# Patient Record
Sex: Female | Born: 1985 | Race: White | Hispanic: No | Marital: Married | Smoking: Never smoker
Health system: Southern US, Community
[De-identification: ages and names within clinical notes are randomized; demographics above are authoritative.]

## PROBLEM LIST (undated history)

## (undated) ENCOUNTER — Ambulatory Visit: Admission: EM | Payer: BLUE CROSS/BLUE SHIELD

## (undated) DIAGNOSIS — O24410 Gestational diabetes mellitus in pregnancy, diet controlled: Secondary | ICD-10-CM

## (undated) DIAGNOSIS — D649 Anemia, unspecified: Secondary | ICD-10-CM

## (undated) HISTORY — DX: Anemia, unspecified: D64.9

## (undated) HISTORY — PX: APPENDECTOMY: SHX54

## (undated) HISTORY — PX: OOPHORECTOMY: SHX86

---

## 2012-06-21 ENCOUNTER — Emergency Department (INDEPENDENT_AMBULATORY_CARE_PROVIDER_SITE_OTHER)
Admission: EM | Admit: 2012-06-21 | Discharge: 2012-06-21 | Disposition: A | Discharge status: Home / Self Care | Payer: BC Managed Care – PPO | Source: Home / Self Care | Attending: Family Medicine | Admitting: Family Medicine

## 2012-06-21 ENCOUNTER — Encounter: Payer: Self-pay | Admitting: *Deleted

## 2012-06-21 ENCOUNTER — Emergency Department (INDEPENDENT_AMBULATORY_CARE_PROVIDER_SITE_OTHER): Payer: BC Managed Care – PPO

## 2012-06-21 DIAGNOSIS — R509 Fever, unspecified: Secondary | ICD-10-CM

## 2012-06-21 DIAGNOSIS — J111 Influenza due to unidentified influenza virus with other respiratory manifestations: Secondary | ICD-10-CM

## 2012-06-21 DIAGNOSIS — R52 Pain, unspecified: Secondary | ICD-10-CM

## 2012-06-21 DIAGNOSIS — R05 Cough: Secondary | ICD-10-CM

## 2012-06-21 LAB — POCT INFLUENZA A/B
Influenza A, POC: POSITIVE
Influenza B, POC: NEGATIVE

## 2012-06-21 MED ORDER — OSELTAMIVIR PHOSPHATE 75 MG PO CAPS: 75.0000 mg | ORAL_CAPSULE | Freq: Two times a day (BID) | ORAL | Status: DC

## 2012-06-21 NOTE — ED Notes (Signed)
C/o body aches, fever, dry cough and fatigue x 2 days. Her baby tested +for influenza recently, no flu vaccine received this year. Taken tylenol otc. She is breastfeeding.

## 2012-06-21 NOTE — ED Provider Notes (Signed)
History     CSN: [id number]  Arrival date & time 06/21/12  1247   First MD Initiated Contact with Patient 06/21/12 1300      Chief Complaint  Patient presents with  . Influenza    HPI  URI Symptoms Onset: 2 days  Description: generalized malaise, fever, chills, body aches, mild URI sxs, cough Modifying factors:  33month old infant with flu   Symptoms Nasal discharge: yes Fever: yes Sore throat: no Cough: yes Wheezing: no Ear pain: no GI symptoms: no Sick contacts: yes  Red Flags  Stiff neck: no Dyspnea: no Rash: no Swallowing difficulty: no  Sinusitis Risk Factors Headache/face pain: no Double sickening: no tooth pain: no  Allergy Risk Factors Sneezing: no Itchy scratchy throat: no Seasonal symptoms: no  Flu Risk Factors Headache: yes muscle aches: yes severe fatigue: yes   History reviewed. No pertinent past medical history.  Past Surgical History  Procedure Date  . Appendectomy   . Oophorectomy     Family History  Problem Relation Age of Onset  . Diabetes Other     History  Substance Use Topics  . Smoking status: Never Smoker   . Smokeless tobacco: Never Used  . Alcohol Use: No    OB History    Grav Para Term Preterm Abortions TAB SAB Ect Mult Living                  Review of Systems  All other systems reviewed and are negative.    Allergies  Penicillins  Home Medications  No current outpatient prescriptions on file.  BP 111/74  Pulse 77  Temp 99.4 F (37.4 C) (Oral)  Resp 16  Ht 5\' 2"  (1.575 m)  Wt 155 lb (70.308 kg)  BMI 28.35 kg/m2  SpO2 97%  Physical Exam  Constitutional: She appears well-developed and well-nourished.  HENT:  Head: Normocephalic and atraumatic.  Right Ear: External ear normal.  Left Ear: External ear normal.       +nasal erythema, rhinorrhea bilaterally, + post oropharyngeal erythema    Eyes: Conjunctivae normal are normal. Pupils are equal, round, and reactive to light.  Neck: Normal  range of motion. Neck supple.  Cardiovascular: Normal rate, regular rhythm and normal heart sounds.   Pulmonary/Chest: Effort normal.       ? Decreased breath sounds in RLL   Abdominal: Soft.  Musculoskeletal: Normal range of motion.  Neurological: She is alert.  Skin: Skin is warm.    ED Course  Procedures (including critical care time)   Labs Reviewed  POCT INFLUENZA A/B   Dg Chest 2 View  06/21/2012  *RADIOLOGY REPORT*  Clinical Data: Bodyaches.  CHEST - 2 VIEW  Comparison: None.  Findings: The lungs are clear and fully expanded.  No infiltrates or edema.  No masses.  No effusions or pneumothoraces.  The heart, mediastinum and hilar contours are normal.  There are no acute bony changes.  IMPRESSION: No active disease.  Normal chest.   Original Report Authenticated By: Sander Radon, M.D.      1. Influenza       MDM  CXR negative for infiltrate Will place on tamiflu for treatment.  Discussed supportive care and infectious/resp red flags.  Otherwise follow up as needed.     The patient and/or caregiver has been counseled thoroughly with regard to treatment plan and/or medications prescribed including dosage, schedule, interactions, rationale for use, and possible side effects and they verbalize understanding. Diagnoses and expected course of recovery  discussed and will return if not improved as expected or if the condition worsens. Patient and/or caregiver verbalized understanding.             Doree Albee, MD 06/21/12 1353

## 2015-04-17 ENCOUNTER — Encounter: Payer: Self-pay | Admitting: Emergency Medicine

## 2015-04-17 ENCOUNTER — Emergency Department
Admission: EM | Admit: 2015-04-17 | Discharge: 2015-04-17 | Disposition: A | Discharge status: Home / Self Care | Payer: BLUE CROSS/BLUE SHIELD | Source: Home / Self Care | Attending: Family Medicine | Admitting: Family Medicine

## 2015-04-17 DIAGNOSIS — J208 Acute bronchitis due to other specified organisms: Secondary | ICD-10-CM

## 2015-04-17 MED ORDER — BENZONATATE 200 MG PO CAPS: 200.0000 mg | ORAL_CAPSULE | Freq: Every day | ORAL | Status: DC

## 2015-04-17 MED ORDER — AZITHROMYCIN 250 MG PO TABS: ORAL_TABLET | ORAL | Status: DC

## 2015-04-17 NOTE — Discharge Instructions (Signed)
Take plain guaifenesin (1200mg extended release tabs such as Mucinex) twice daily, with plenty of water, for cough and congestion.  May add Pseudoephedrine (30mg, one or two every 4 to 6 hours) for sinus congestion.  Get adequate rest.   °May use Afrin nasal spray (or generic oxymetazoline) twice daily for about 5 days and then discontinue.  Also recommend using saline nasal spray several times daily and saline nasal irrigation (AYR is a common brand).   °Try warm salt water gargles for sore throat.  °Stop all antihistamines for now, and other non-prescription cough/cold preparations. °Follow-up with family doctor if not improving about 7 to10 days.  °

## 2015-04-17 NOTE — ED Provider Notes (Signed)
CSN: [id number]     Arrival date & time 04/17/15  4098 History   First MD Initiated Contact with Patient 04/17/15 1002     Chief Complaint  Patient presents with  . Cough      HPI Comments: About 2.5 weeks ago patient developed typical cold-like symptoms including mild sore throat, sinus congestion, headache, and fatigue.  A cough developed about 3 days later. The non-productive cough persists, and is worse at night.  During the past 3 nights she has had chills.  She feels tightness in her anterior chest. She has had pneumonia in the distant past.  The history is provided by the patient.    History reviewed. No pertinent past medical history. Past Surgical History  Procedure Laterality Date  . Appendectomy    . Oophorectomy     Family History  Problem Relation Age of Onset  . Diabetes Other   . Asthma Mother    Social History  Substance Use Topics  . Smoking status: Never Smoker   . Smokeless tobacco: Never Used  . Alcohol Use: No   OB History    No data available     Review of Systems + sore throat + hoarse + cough No pleuritic pain No wheezing + nasal congestion + post-nasal drainage No sinus pain/pressure No itchy/red eyes No  earache No hemoptysis No SOB No fever, + chills No nausea No vomiting No abdominal pain No diarrhea No urinary symptoms No skin rash + fatigue No myalgias + headache Used OTC meds without relief  Allergies  Penicillins  Home Medications   Prior to Admission medications   Medication Sig Start Date End Date Taking? Authorizing Provider  azithromycin (ZITHROMAX Z-PAK) 250 MG tablet Take 2 tabs today; then begin one tab once daily for 4 more days. 04/17/15   Lattie Haw, MD  benzonatate (TESSALON) 200 MG capsule Take 1 capsule (200 mg total) by mouth at bedtime. Take as needed for cough 04/17/15   Lattie Haw, MD   Meds Ordered and Administered this Visit  Medications - No data to display  BP 111/73 mmHg  Pulse 65   Temp(Src) 98 F (36.7 C) (Oral)  Ht  (1.575 m)  Wt 146 lb (66.225 kg)  BMI 26.70 kg/m2  SpO2 100%  LMP 03/20/2015 (Exact Date) No data found.   Physical Exam Nursing notes and Vital Signs reviewed. Appearance:  Patient appears stated age, and in no acute distress Eyes:  Pupils are equal, round, and reactive to light and accomodation.  Extraocular movement is intact.  Conjunctivae are not inflamed  Ears:  Canals normal.  Tympanic membranes normal.  Nose:  Mildly congested turbinates.  No sinus tenderness.  Pharynx:  Normal Neck:  Supple.  Tender enlarged posterior nodes are palpated bilaterally  Lungs:  Clear to auscultation.  Breath sounds are equal.  Moving air well. Heart:  Regular rate and rhythm without murmurs, rubs, or gallops.  Abdomen:  Nontender without masses or hepatosplenomegaly.  Bowel sounds are present.  No CVA or flank tenderness.  Extremities:  No edema.  No calf tenderness Skin:  No rash present.   ED Course  Procedures none     MDM   1. Acute bronchitis due to other specified organisms; suspect early bacterial secondary infection   Begin Z-pak for atypical coverage.  Prescription written for Benzonatate [facility]) to take at bedtime for night-time cough.  Take plain guaifenesin (  extended release tabs such as Mucinex) twice daily, with plenty of  water, for cough and congestion.  May add Pseudoephedrine (30mg , one or two every 4 to 6 hours) for sinus congestion.  Get adequate rest.   May use Afrin nasal spray (or generic oxymetazoline) twice daily for about 5 days and then discontinue.  Also recommend using saline nasal spray several times daily and saline nasal irrigation (AYR is a common brand).   Try warm salt water gargles for sore throat.  Stop all antihistamines for now, and other non-prescription cough/cold preparations.   Follow-up with family doctor if not improving about 7 to10 days.     Lattie HawStephen A Edilson Vital, MD 04/17/15 [phone]

## 2015-04-17 NOTE — ED Notes (Signed)
Dry cough x 2 weeks, congestion, headache, hoarseness, chills started about 3 days ago

## 2015-06-01 NOTE — L&D Delivery Note (Signed)
Delivery Note At 6:38 AM a viable female was delivered via  (Presentation: vertwx;LOA  ).  APGAR: 8, 8; weight  .   Placenta status: spont,shultz .  Cord:3vc  with the following complications:none .  Cord pH: n/a  Anesthesia:  epidural Episiotomy:  none Lacerations:  2nd perineal Suture Repair: 3.0 vicryl Est. Blood Loss (mL):    Mom to postpartum.  Baby to Couplet care / Skin to Skin.  Judy Alvarez 01/26/2016, 6:52 AM

## 2015-06-20 ENCOUNTER — Ambulatory Visit (INDEPENDENT_AMBULATORY_CARE_PROVIDER_SITE_OTHER): Payer: PRIVATE HEALTH INSURANCE

## 2015-06-20 ENCOUNTER — Ambulatory Visit (INDEPENDENT_AMBULATORY_CARE_PROVIDER_SITE_OTHER): Payer: BLUE CROSS/BLUE SHIELD | Admitting: Family

## 2015-06-20 ENCOUNTER — Other Ambulatory Visit: Payer: Self-pay | Admitting: Family

## 2015-06-20 ENCOUNTER — Encounter: Payer: Self-pay | Admitting: Family

## 2015-06-20 ENCOUNTER — Ambulatory Visit: Payer: PRIVATE HEALTH INSURANCE

## 2015-06-20 VITALS — BP 112/73 | HR 85 | Wt 150.0 lb

## 2015-06-20 DIAGNOSIS — O26899 Other specified pregnancy related conditions, unspecified trimester: Secondary | ICD-10-CM

## 2015-06-20 DIAGNOSIS — Z3A08 8 weeks gestation of pregnancy: Secondary | ICD-10-CM | POA: Diagnosis not present

## 2015-06-20 DIAGNOSIS — Z1151 Encounter for screening for human papillomavirus (HPV): Secondary | ICD-10-CM | POA: Diagnosis not present

## 2015-06-20 DIAGNOSIS — R102 Pelvic and perineal pain: Secondary | ICD-10-CM

## 2015-06-20 DIAGNOSIS — Z3481 Encounter for supervision of other normal pregnancy, first trimester: Secondary | ICD-10-CM | POA: Diagnosis not present

## 2015-06-20 DIAGNOSIS — Z124 Encounter for screening for malignant neoplasm of cervix: Secondary | ICD-10-CM | POA: Diagnosis not present

## 2015-06-20 DIAGNOSIS — O26891 Other specified pregnancy related conditions, first trimester: Secondary | ICD-10-CM

## 2015-06-20 DIAGNOSIS — IMO0002 Reserved for concepts with insufficient information to code with codable children: Secondary | ICD-10-CM

## 2015-06-20 DIAGNOSIS — Z3480 Encounter for supervision of other normal pregnancy, unspecified trimester: Secondary | ICD-10-CM | POA: Insufficient documentation

## 2015-06-20 DIAGNOSIS — Z90721 Acquired absence of ovaries, unilateral: Secondary | ICD-10-CM

## 2015-06-20 MED ORDER — DOXYLAMINE-PYRIDOXINE 10-10 MG PO TBEC: DELAYED_RELEASE_TABLET | ORAL | Status: DC

## 2015-06-20 NOTE — Progress Notes (Signed)
Pt here for initial prenatal visit. Current complaints are constipation and nausea Pt states she is due for a pap today. Bedside US shows single IUP measuring [redacted]w[redacted]d CRL - 169 FHR Desires [facility] - will call with new phone number on Monday

## 2015-06-20 NOTE — Progress Notes (Signed)
   Subjective:    Judy Alvarez is a G3P2000 [redacted]w[redacted]d being seen today for her first obstetrical visit.  Her obstetrical history is uncomplicated.  Hx of two NSVD at [facility].  Patient does intend to breast feed. Pregnancy history fully reviewed.  Patient reports nausea and intermittent pain on left pelvic with intercourse.  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  Unknown the specific name. Ceasar Mons Vitals:   06/20/15 0904  BP: 112/73  Pulse: 85  Weight: 150 lb (68.04 kg)    HISTORY: OB History  Gravida Para Term Preterm AB SAB TAB Ectopic Multiple Living  # Outcome Date GA Lbr Len/2nd Weight Sex Delivery Anes PTL Lv  3 Current           2 Term  [redacted]w[redacted]d    Vag-Spont     1 Term  [redacted]w[redacted]d    Vag-Spont        Past Medical History  Diagnosis Date  . Anemia    Past Surgical History  Procedure Laterality Date  . Appendectomy    . Oophorectomy     Family History  Problem Relation Age of Onset  . Diabetes Other   . Asthma Mother      Exam   BP 112/73 mmHg  Pulse 85  Wt 150 lb (68.04 kg)  LMP 04/25/2015 (Exact Date) Uterine Size: size equals dates  Pelvic Exam:    Perineum: No Hemorrhoids, Normal Perineum   Vulva: normal   Vagina:  normal mucosa, normal discharge, no palpable nodules   pH: Not done   Cervix: no bleeding following Pap, no cervical motion tenderness and no lesions   Adnexa: normal adnexa and no mass, fullness, tenderness   Bony Pelvis: Adequate  System: Breast:  No nipple retraction or dimpling, No nipple discharge or bleeding, No axillary or supraclavicular adenopathy, Normal to palpation without dominant masses   Skin: normal coloration and turgor, no rashes    Neurologic: negative   Extremities: normal strength, tone, and muscle mass   HEENT neck supple with midline trachea and thyroid without masses   Mouth/Teeth mucous membranes moist, pharynx normal without lesions   Neck supple and no masses   Cardiovascular: regular rate and rhythm, no murmurs or gallops   Respiratory:  appears well, vitals normal, no respiratory distress, acyanotic, normal RR, neck free of mass or lymphadenopathy, chest clear, no wheezing, crepitations, rhonchi, normal symmetric air entry   Abdomen: soft, non-tender; bowel sounds normal; no masses,  no organomegaly   Urinary: urethral meatus normal       Assessment:    Pregnancy: [id number] Pelvic Pain with Intercourse Patient Active Problem List   Diagnosis Date Noted  . Supervision of normal pregnancy, antepartum 06/20/2015        Plan:     Initial labs drawn.  Pap smear collected. RX Diclegis Pelvic ultrasound ordered to assess adnexa Prenatal vitamins. Problem list reviewed and updated. Genetic Screening discussed First Screen: declined. Desires to be a [employer] patient;  Follow up in 4 weeks.  Marlis Edelson 06/20/2015

## 2015-06-20 NOTE — Patient Instructions (Signed)
First Trimester of Pregnancy The first trimester of pregnancy is from week 1 until the end of week 12 (months 1 through 3). A week after a sperm fertilizes an egg, the egg will implant on the wall of the uterus. This embryo will begin to develop into a baby. Genes from you and your partner are forming the baby. The female genes determine whether the baby is a boy or a girl. At 6-8 weeks, the eyes and face are formed, and the heartbeat can be seen on ultrasound. At the end of 12 weeks, all the baby's organs are formed.  Now that you are pregnant, you will want to do everything you can to have a healthy baby. Two of the most important things are to get good prenatal care and to follow your health care provider's instructions. Prenatal care is all the medical care you receive before the baby's birth. This care will help prevent, find, and treat any problems during the pregnancy and childbirth. BODY CHANGES Your body goes through many changes during pregnancy. The changes vary from woman to woman.   You may gain or lose a couple of pounds at first.  You may feel sick to your stomach (nauseous) and throw up (vomit). If the vomiting is uncontrollable, call your health care provider.  You may tire easily.  You may develop headaches that can be relieved by medicines approved by your health care provider.  You may urinate more often. Painful urination may mean you have a bladder infection.  You may develop heartburn as a result of your pregnancy.  You may develop constipation because certain hormones are causing the muscles that push waste through your intestines to slow down.  You may develop hemorrhoids or swollen, bulging veins (varicose veins).  Your breasts may begin to grow larger and become tender. Your nipples may stick out more, and the tissue that surrounds them (areola) may become darker.  Your gums may bleed and may be sensitive to brushing and flossing.  Dark spots or blotches (chloasma,  mask of pregnancy) may develop on your face. This will likely fade after the baby is born.  Your menstrual periods will stop.  You may have a loss of appetite.  You may develop cravings for certain kinds of food.  You may have changes in your emotions from day to day, such as being excited to be pregnant or being concerned that something may go wrong with the pregnancy and baby.  You may have more vivid and strange dreams.  You may have changes in your hair. These can include thickening of your hair, rapid growth, and changes in texture. Some women also have hair loss during or after pregnancy, or hair that feels dry or thin. Your hair will most likely return to normal after your baby is born. WHAT TO EXPECT AT YOUR PRENATAL VISITS During a routine prenatal visit:  You will be weighed to make sure you and the baby are growing normally.  Your blood pressure will be taken.  Your abdomen will be measured to track your baby's growth.  The fetal heartbeat will be listened to starting around week 10 or 12 of your pregnancy.  Test results from any previous visits will be discussed. Your health care provider may ask you:  How you are feeling.  If you are feeling the baby move.  If you have had any abnormal symptoms, such as leaking fluid, bleeding, severe headaches, or abdominal cramping.  If you are using any tobacco products,   including cigarettes, chewing tobacco, and electronic cigarettes.  If you have any questions. Other tests that may be performed during your first trimester include:  Blood tests to find your blood type and to check for the presence of any previous infections. They will also be used to check for low iron levels (anemia) and Rh antibodies. Later in the pregnancy, blood tests for diabetes will be done along with other tests if problems develop.  Urine tests to check for infections, diabetes, or protein in the urine.  An ultrasound to confirm the proper growth  and development of the baby.  An amniocentesis to check for possible genetic problems.  Fetal screens for spina bifida and Down syndrome.  You may need other tests to make sure you and the baby are doing well.  HIV (human immunodeficiency virus) testing. Routine prenatal testing includes screening for HIV, unless you choose not to have this test. HOME CARE INSTRUCTIONS  Medicines  Follow your health care provider's instructions regarding medicine use. Specific medicines may be either safe or unsafe to take during pregnancy.  Take your prenatal vitamins as directed.  If you develop constipation, try taking a stool softener if your health care provider approves. Diet  Eat regular, well-balanced meals. Choose a variety of foods, such as meat or vegetable-based protein, fish, milk and low-fat dairy products, vegetables, fruits, and whole grain breads and cereals. Your health care provider will help you determine the amount of weight gain that is right for you.  Avoid raw meat and uncooked cheese. These carry germs that can cause birth defects in the baby.  Eating four or five small meals rather than three large meals a day may help relieve nausea and vomiting. If you start to feel nauseous, eating a few soda crackers can be helpful. Drinking liquids between meals instead of during meals also seems to help nausea and vomiting.  If you develop constipation, eat more high-fiber foods, such as fresh vegetables or fruit and whole grains. Drink enough fluids to keep your urine clear or pale yellow. Activity and Exercise  Exercise only as directed by your health care provider. Exercising will help you:  Control your weight.  Stay in shape.  Be prepared for labor and delivery.  Experiencing pain or cramping in the lower abdomen or low back is a good sign that you should stop exercising. Check with your health care provider before continuing normal exercises.  Try to avoid standing for long  periods of time. Move your legs often if you must stand in one place for a long time.  Avoid heavy lifting.  Wear low-heeled shoes, and practice good posture.  You may continue to have sex unless your health care provider directs you otherwise. Relief of Pain or Discomfort  Wear a good support bra for breast tenderness.   Take warm sitz baths to soothe any pain or discomfort caused by hemorrhoids. Use hemorrhoid cream if your health care provider approves.   Rest with your legs elevated if you have leg cramps or low back pain.  If you develop varicose veins in your legs, wear support hose. Elevate your feet for 15 minutes, 3-4 times a day. Limit salt in your diet. Prenatal Care  Schedule your prenatal visits by the twelfth week of pregnancy. They are usually scheduled monthly at first, then more often in the last 2 months before delivery.  Write down your questions. Take them to your prenatal visits.  Keep all your prenatal visits as directed by your   health care provider. Safety  Wear your seat belt at all times when driving.  Make a list of emergency phone numbers, including numbers for family, friends, the hospital, and police and fire departments. General Tips  Ask your health care provider for a referral to a local prenatal education class. Begin classes no later than at the beginning of month 6 of your pregnancy.  Ask for help if you have counseling or nutritional needs during pregnancy. Your health care provider can offer advice or refer you to specialists for help with various needs.  Do not use hot tubs, steam rooms, or saunas.  Do not douche or use tampons or scented sanitary pads.  Do not cross your legs for long periods of time.  Avoid cat litter boxes and soil used by cats. These carry germs that can cause birth defects in the baby and possibly loss of the fetus by miscarriage or stillbirth.  Avoid all smoking, herbs, alcohol, and medicines not prescribed by  your health care provider. Chemicals in these affect the formation and growth of the baby.  Do not use any tobacco products, including cigarettes, chewing tobacco, and electronic cigarettes. If you need help quitting, ask your health care provider. You may receive counseling support and other resources to help you quit.  Schedule a dentist appointment. At home, brush your teeth with a soft toothbrush and be gentle when you floss. SEEK MEDICAL CARE IF:   You have dizziness.  You have mild pelvic cramps, pelvic pressure, or nagging pain in the abdominal area.  You have persistent nausea, vomiting, or diarrhea.  You have a bad smelling vaginal discharge.  You have pain with urination.  You notice increased swelling in your face, hands, legs, or ankles. SEEK IMMEDIATE MEDICAL CARE IF:   You have a fever.  You are leaking fluid from your vagina.  You have spotting or bleeding from your vagina.  You have severe abdominal cramping or pain.  You have rapid weight gain or loss.  You vomit blood or material that looks like coffee grounds.  You are exposed to German measles and have never had them.  You are exposed to fifth disease or chickenpox.  You develop a severe headache.  You have shortness of breath.  You have any kind of trauma, such as from a fall or a car accident.   This information is not intended to replace advice given to you by your health care provider. Make sure you discuss any questions you have with your health care provider.   Document Released: 05/11/2001 Document Revised: 06/07/2014 Document Reviewed: 03/27/2013 Elsevier Interactive Patient Education 2016 [employer] Inc[employer]  

## 2015-06-21 LAB — GC/CHLAMYDIA PROBE AMP
CT PROBE, AMP APTIMA: NOT DETECTED
GC PROBE AMP APTIMA: NOT DETECTED

## 2015-06-22 LAB — CULTURE, OB URINE

## 2015-06-23 LAB — PRENATAL PROFILE (SOLSTAS)
Antibody Screen: NEGATIVE
BASOS PCT: 0 % (ref 0–1)
Basophils Absolute: 0 10*3/uL (ref 0.0–0.1)
Eosinophils Absolute: 0.2 10*3/uL (ref 0.0–0.7)
Eosinophils Relative: 2 % (ref 0–5)
HEMATOCRIT: 40.3 % (ref 36.0–46.0)
HEMOGLOBIN: 13.6 g/dL (ref 12.0–15.0)
HEP B S AG: NEGATIVE
HIV: NONREACTIVE
LYMPHS PCT: 25 % (ref 12–46)
Lymphs Abs: 2.1 10*3/uL (ref 0.7–4.0)
MCH: 29.6 pg (ref 26.0–34.0)
MCHC: 33.7 g/dL (ref 30.0–36.0)
MCV: 87.6 fL (ref 78.0–100.0)
MONOS PCT: 6 % (ref 3–12)
MPV: 9.3 fL (ref 8.6–12.4)
Monocytes Absolute: 0.5 10*3/uL (ref 0.1–1.0)
NEUTROS ABS: 5.5 10*3/uL (ref 1.7–7.7)
NEUTROS PCT: 67 % (ref 43–77)
Platelets: 328 10*3/uL (ref 150–400)
RBC: 4.6 MIL/uL (ref 3.87–5.11)
RDW: 14.4 % (ref 11.5–15.5)
RUBELLA: 1.8 {index} — AB (ref <0.90)
Rh Type: POSITIVE
WBC: 8.2 10*3/uL (ref 4.0–10.5)

## 2015-06-23 LAB — CYTOLOGY - PAP

## 2015-07-25 ENCOUNTER — Encounter: Payer: BLUE CROSS/BLUE SHIELD | Admitting: Family

## 2015-07-29 ENCOUNTER — Ambulatory Visit (INDEPENDENT_AMBULATORY_CARE_PROVIDER_SITE_OTHER): Payer: PRIVATE HEALTH INSURANCE | Admitting: Obstetrics & Gynecology

## 2015-07-29 VITALS — BP 106/62 | HR 69 | Wt 153.0 lb

## 2015-07-29 DIAGNOSIS — Z3482 Encounter for supervision of other normal pregnancy, second trimester: Secondary | ICD-10-CM

## 2015-07-29 NOTE — Progress Notes (Signed)
Subjective:  Judy Alvarez is a 30 y.o. MW G3P2002 at [redacted]w[redacted]d being seen today for ongoing prenatal care.  She is currently monitored for the following issues for this low-risk pregnancy and has Supervision of normal pregnancy, antepartum and H/O oophorectomy on her problem list.  Patient reports no complaints.  Contractions: Not present. Vag. Bleeding: None.  Movement: Absent. Denies leaking of fluid.   The following portions of the patient's history were reviewed and updated as appropriate: allergies, current medications, past family history, past medical history, past social history, past surgical history and problem list. Problem list updated.  Objective:   Filed Vitals:   07/29/15 1447  BP: 106/62  Pulse: 69  Weight: 153 lb (69.4 kg)    Fetal Status: Fetal Heart Rate (bpm): 154   Movement: Absent     General:  Alert, oriented and cooperative. Patient is in no acute distress.  Skin: Skin is warm and dry. No rash noted.   Cardiovascular: Normal heart rate noted  Respiratory: Normal respiratory effort, no problems with respiration noted  Abdomen: Soft, gravid, appropriate for gestational age. Pain/Pressure: Absent     Pelvic: Vag. Bleeding: None Vag D/C Character: Thin   Cervical exam deferred        Extremities: Normal range of motion.  Edema: None  Mental Status: Normal mood and affect. Normal behavior. Normal judgment and thought content.   Urinalysis: Urine Protein: Negative Urine Glucose: Negative  Assessment and Plan:  Pregnancy: G3P2002 at [redacted]w[redacted]d  1. Supervision of normal pregnancy, antepartum, second trimester She is considering baby scripts (is getting her phone fixed today)   Preterm labor symptoms and general obstetric precautions including but not limited to vaginal bleeding, contractions, leaking of fluid and fetal movement were reviewed in detail with the patient. Please refer to After Visit Summary for other counseling recommendations.  Return for RTC at 20 weeks  if she is going to do BAbY scripts.   Allie Bossier, MD

## 2015-07-31 ENCOUNTER — Telehealth: Payer: Self-pay | Admitting: *Deleted

## 2015-07-31 DIAGNOSIS — Z3482 Encounter for supervision of other normal pregnancy, second trimester: Secondary | ICD-10-CM

## 2015-07-31 NOTE — Telephone Encounter (Signed)
Enrolled into [facility]

## 2015-08-26 ENCOUNTER — Encounter: Payer: PRIVATE HEALTH INSURANCE | Admitting: Obstetrics & Gynecology

## 2015-09-02 ENCOUNTER — Ambulatory Visit (HOSPITAL_COMMUNITY)
Admission: RE | Admit: 2015-09-02 | Discharge: 2015-09-02 | Disposition: A | Discharge status: Home / Self Care | Payer: PRIVATE HEALTH INSURANCE | Source: Ambulatory Visit | Attending: Obstetrics & Gynecology | Admitting: Obstetrics & Gynecology

## 2015-09-02 ENCOUNTER — Other Ambulatory Visit: Payer: Self-pay | Admitting: Obstetrics & Gynecology

## 2015-09-02 DIAGNOSIS — Z3A18 18 weeks gestation of pregnancy: Secondary | ICD-10-CM

## 2015-09-02 DIAGNOSIS — Z36 Encounter for antenatal screening of mother: Secondary | ICD-10-CM | POA: Insufficient documentation

## 2015-09-02 DIAGNOSIS — Z3482 Encounter for supervision of other normal pregnancy, second trimester: Secondary | ICD-10-CM

## 2015-09-02 DIAGNOSIS — Z3689 Encounter for other specified antenatal screening: Secondary | ICD-10-CM

## 2015-10-10 ENCOUNTER — Ambulatory Visit (INDEPENDENT_AMBULATORY_CARE_PROVIDER_SITE_OTHER): Payer: PRIVATE HEALTH INSURANCE | Admitting: Family

## 2015-10-10 VITALS — BP 112/69 | HR 82 | Wt 159.0 lb

## 2015-10-10 DIAGNOSIS — Z3482 Encounter for supervision of other normal pregnancy, second trimester: Secondary | ICD-10-CM

## 2015-10-10 DIAGNOSIS — I8393 Asymptomatic varicose veins of bilateral lower extremities: Secondary | ICD-10-CM

## 2015-10-10 DIAGNOSIS — I8390 Asymptomatic varicose veins of unspecified lower extremity: Secondary | ICD-10-CM

## 2015-10-10 MED ORDER — MEDICAL COMPRESSION PANTYHOSE MISC: Status: DC

## 2015-10-10 NOTE — Progress Notes (Signed)
Subjective:  Judy ShiversJessica Alvarez is a 30 y.o. G3P2002 at 3438w0d being seen today for ongoing prenatal care.  She is currently monitored for the following issues for this low-risk pregnancy and has Supervision of normal pregnancy, antepartum and H/O oophorectomy on her problem list.  Patient reports varicose veins getting worse on right leg.  Also reports coughing x 2 weeks, no fever, improving.  Contractions: Not present. Vag. Bleeding: None.  Movement: Present. Denies leaking of fluid.   The following portions of the patient's history were reviewed and updated as appropriate: allergies, current medications, past family history, past medical history, past social history, past surgical history and problem list. Problem list updated.  Objective:   Filed Vitals:   10/10/15 1025  BP: 112/69  Pulse: 82  Weight: 159 lb (72.122 kg)    Fetal Status: Fetal Heart Rate (bpm): 146 Fundal Height: 25 cm Movement: Present     General:  Alert, oriented and cooperative. Patient is in no acute distress.  Skin: Skin is warm and dry. No rash noted.   Cardiovascular: Normal heart rate noted  Respiratory: Normal respiratory effort, no problems with respiration noted; lungs CTA bilat  Abdomen: Soft, gravid, appropriate for gestational age. Pain/Pressure: Absent     Pelvic: Vag. Bleeding: None Vag D/C Character: Thin   Cervical exam deferred        Extremities: Normal range of motion.  Edema: None; right leg with varicosities; no swelling or warmth  Mental Status: Normal mood and affect. Normal behavior. Normal judgment and thought content.   Urinalysis: Urine Protein: Negative Urine Glucose: Negative  Assessment and Plan:  Pregnancy: G3P2002 at 2938w0d  1. Supervision of normal pregnancy, antepartum, second trimester - Discussed third trimester labs for next visit  2. Varicose vein of leg - Compression hose - Elastic Bandages & Supports (MEDICAL COMPRESSION PANTYHOSE) MISC; Pregnancy compression pantyhose,  15-20 compression  Dispense: 1 each; Refill: 0  Preterm labor symptoms and general obstetric precautions including but not limited to vaginal bleeding, contractions, leaking of fluid and fetal movement were reviewed in detail with the patient. Please refer to After Visit Summary for other counseling recommendations.  Return in about 4 weeks (around 11/07/2015).   Eino FarberWalidah Kennith GainN Karim, CNM

## 2015-10-10 NOTE — Patient Instructions (Signed)
   [facility] [address]  [place], [place] [id number]

## 2015-11-03 ENCOUNTER — Ambulatory Visit (INDEPENDENT_AMBULATORY_CARE_PROVIDER_SITE_OTHER): Payer: Medicaid Other | Admitting: Obstetrics & Gynecology

## 2015-11-03 VITALS — BP 99/63 | HR 74 | Wt 163.0 lb

## 2015-11-03 DIAGNOSIS — Z36 Encounter for antenatal screening of mother: Secondary | ICD-10-CM | POA: Diagnosis not present

## 2015-11-03 DIAGNOSIS — Z3482 Encounter for supervision of other normal pregnancy, second trimester: Secondary | ICD-10-CM

## 2015-11-03 LAB — CBC
HEMATOCRIT: 36.4 % (ref 35.0–45.0)
HEMOGLOBIN: 12 g/dL (ref 11.7–15.5)
MCH: 30.3 pg (ref 27.0–33.0)
MCHC: 33 g/dL (ref 32.0–36.0)
MCV: 91.9 fL (ref 80.0–100.0)
MPV: 8.8 fL (ref 7.5–12.5)
Platelets: 230 10*3/uL (ref 140–400)
RBC: 3.96 MIL/uL (ref 3.80–5.10)
RDW: 14 % (ref 11.0–15.0)
WBC: 7.9 10*3/uL (ref 3.8–10.8)

## 2015-11-03 LAB — GLUCOSE TOLERANCE, 1 HOUR (50G) W/O FASTING: Glucose, 1 Hr, gestational: 109 mg/dL (ref <140)

## 2015-11-03 NOTE — Progress Notes (Signed)
Subjective:  Judy ShiversJessica Alvarez is a 30 y.o. G3P2002 at 2356w3d being seen today for ongoing prenatal care.  She is currently monitored for the following issues for this low-risk pregnancy and has Supervision of normal pregnancy, antepartum and H/O oophorectomy on her problem list.  Patient reports no complaints.  Contractions: Not present. Vag. Bleeding: None.  Movement: Present. Denies leaking of fluid.   The following portions of the patient's history were reviewed and updated as appropriate: allergies, current medications, past family history, past medical history, past social history, past surgical history and problem list. Problem list updated.  Objective:   Filed Vitals:   11/03/15 1323  BP: 99/63  Pulse: 74  Weight: 163 lb (73.936 kg)    Fetal Status: Fetal Heart Rate (bpm): 144 Fundal Height: 29 cm Movement: Present     General:  Alert, oriented and cooperative. Patient is in no acute distress.  Skin: Skin is warm and dry. No rash noted.   Cardiovascular: Normal heart rate noted  Respiratory: Normal respiratory effort, no problems with respiration noted  Abdomen: Soft, gravid, appropriate for gestational age. Pain/Pressure: Absent     Pelvic: Vag. Bleeding: None Vag D/C Character: Thin   Cervical exam deferred        Extremities: Normal range of motion.  Edema: None  Mental Status: Normal mood and affect. Normal behavior. Normal judgment and thought content.   Urinalysis: Urine Protein: Negative Urine Glucose: Negative  Assessment and Plan:  Pregnancy: G3P2002 at 5056w3d  1. Normal pregnancy in multigravida in second trimester - Glucose Tolerance, 1 HR (50g) - CBC - HIV antibody (with reflex) - RPR - Call baby scripts to help pair BP cuff sync.  Preterm labor symptoms and general obstetric precautions including but not limited to vaginal bleeding, contractions, leaking of fluid and fetal movement were reviewed in detail with the patient. Please refer to After Visit Summary  for other counseling recommendations.  Return in about 4 weeks (around 12/01/2015).   Judy DukesKelly H Jacquelynn Friend, MD

## 2015-11-03 NOTE — Progress Notes (Signed)
Baby scripts compliant 

## 2015-11-04 LAB — HIV ANTIBODY (ROUTINE TESTING W REFLEX): HIV 1&2 Ab, 4th Generation: NONREACTIVE

## 2015-11-04 LAB — RPR

## 2015-11-07 ENCOUNTER — Encounter: Payer: PRIVATE HEALTH INSURANCE | Admitting: Certified Nurse Midwife

## 2015-12-05 ENCOUNTER — Ambulatory Visit (INDEPENDENT_AMBULATORY_CARE_PROVIDER_SITE_OTHER): Payer: PRIVATE HEALTH INSURANCE | Admitting: Family

## 2015-12-05 VITALS — BP 113/67 | HR 73 | Wt 166.0 lb

## 2015-12-05 DIAGNOSIS — Z3483 Encounter for supervision of other normal pregnancy, third trimester: Secondary | ICD-10-CM

## 2015-12-05 NOTE — Progress Notes (Signed)
Subjective:  Judy Alvarez is a 30 y.o. G3P2002 at 3734w0d being seen today for ongoing prenatal care.  She is currently monitored for the following issues for this low-risk pregnancy and has Supervision of normal pregnancy, antepartum and H/O oophorectomy on her problem list.  Patient reports no complaints.  Strategizing on how to have baby come in August (all family bday in August).    Contractions: Irritability. Vag. Bleeding: None.  Movement: Present. Denies leaking of fluid.   The following portions of the patient's history were reviewed and updated as appropriate: allergies, current medications, past family history, past medical history, past social history, past surgical history and problem list. Problem list updated.  Objective:   Filed Vitals:   12/05/15 1014  BP: 113/67  Pulse: 73  Weight: 166 lb (75.297 kg)    Fetal Status: Fetal Heart Rate (bpm): 135 Fundal Height: 32 cm Movement: Present     General:  Alert, oriented and cooperative. Patient is in no acute distress.  Skin: Skin is warm and dry. No rash noted.   Cardiovascular: Normal heart rate noted  Respiratory: Normal respiratory effort, no problems with respiration noted  Abdomen: Soft, gravid, appropriate for gestational age. Pain/Pressure: Present     Pelvic:  Cervical exam deferred        Extremities: Normal range of motion.  Edema: None  Mental Status: Normal mood and affect. Normal behavior. Normal judgment and thought content.   Urinalysis: Urine Protein: Negative Urine Glucose: Negative  Assessment and Plan:  Pregnancy: G3P2002 at 5534w0d  1. Supervision of normal pregnancy, antepartum, third trimester - Reviewed testing schedule and BabyScripts - Fetal Movement review  Preterm labor symptoms and general obstetric precautions including but not limited to vaginal bleeding, contractions, leaking of fluid and fetal movement were reviewed in detail with the patient. Please refer to After Visit Summary for other  counseling recommendations.  Return in about 4 weeks (around 01/02/2016) for BabyScripts.   Eino FarberWalidah Kennith GainN Karim, CNM

## 2016-01-02 ENCOUNTER — Ambulatory Visit (INDEPENDENT_AMBULATORY_CARE_PROVIDER_SITE_OTHER): Payer: Medicaid Other | Admitting: Advanced Practice Midwife

## 2016-01-02 ENCOUNTER — Encounter: Payer: Self-pay | Admitting: Advanced Practice Midwife

## 2016-01-02 VITALS — BP 112/72 | HR 96 | Wt 172.0 lb

## 2016-01-02 DIAGNOSIS — Z113 Encounter for screening for infections with a predominantly sexual mode of transmission: Secondary | ICD-10-CM | POA: Diagnosis not present

## 2016-01-02 DIAGNOSIS — Z3493 Encounter for supervision of normal pregnancy, unspecified, third trimester: Secondary | ICD-10-CM

## 2016-01-02 LAB — OB RESULTS CONSOLE GBS: GBS: NEGATIVE

## 2016-01-02 LAB — OB RESULTS CONSOLE GC/CHLAMYDIA: Gonorrhea: NEGATIVE

## 2016-01-02 NOTE — Progress Notes (Signed)
Subjective:  Judy Alvarez is a 29 y.o. G3P2002 at [redacted]w[redacted]d being seen today for ongoing prenatal care.  She is currently monitored for the following issues for this low-risk pregnancy and has Supervision of normal pregnancy, antepartum and H/O oophorectomy on her problem list.  Patient reports no complaints.  Contractions: Irritability. Vag. Bleeding: None.  Movement: Present. Denies leaking of fluid.   The following portions of the patient's history were reviewed and updated as appropriate: allergies, current medications, past family history, past medical history, past social history, past surgical history and problem list. Problem list updated.  Objective:   Vitals:   01/02/16 1121  BP: 112/72  Pulse: 96  Weight: 172 lb (78 kg)    Fetal Status: Fetal Heart Rate (bpm): 132   Movement: Present     General:  Alert, oriented and cooperative. Patient is in no acute distress.  Skin: Skin is warm and dry. No rash noted.   Cardiovascular: Normal heart rate noted  Respiratory: Normal respiratory effort, no problems with respiration noted  Abdomen: Soft, gravid, appropriate for gestational age. Pain/Pressure: Present     Pelvic:  Cervical exam performed        Extremities: Normal range of motion.  Edema: None  Mental Status: Normal mood and affect. Normal behavior. Normal judgment and thought content.   Cervix FT/40%/-3/vertex (confirmed by [place])  Urinalysis: Urine Protein: Negative Urine Glucose: Negative  Assessment and Plan:  Pregnancy: G3P2002 at [redacted]w[redacted]d  1. Normal pregnancy, third trimester      Discussed where MAU is and labor signs       May want membranes swept at 38 weeks  - Culture, beta strep (group b only) - Urine cytology ancillary only  Preterm labor symptoms and general obstetric precautions including but not limited to vaginal bleeding, contractions, leaking of fluid and fetal movement were reviewed in detail with the patient. Please refer to After Visit Summary for  other counseling recommendations.  RTO 1 week  Aviva Signs, CNM

## 2016-01-02 NOTE — Patient Instructions (Signed)

## 2016-01-04 LAB — CULTURE, BETA STREP (GROUP B ONLY)

## 2016-01-06 LAB — URINE CYTOLOGY ANCILLARY ONLY
CHLAMYDIA, DNA PROBE: NEGATIVE
Neisseria Gonorrhea: NEGATIVE

## 2016-01-09 ENCOUNTER — Ambulatory Visit (INDEPENDENT_AMBULATORY_CARE_PROVIDER_SITE_OTHER): Payer: PRIVATE HEALTH INSURANCE | Admitting: Family

## 2016-01-09 VITALS — BP 112/72 | HR 74 | Wt 174.0 lb

## 2016-01-09 DIAGNOSIS — Z3483 Encounter for supervision of other normal pregnancy, third trimester: Secondary | ICD-10-CM

## 2016-01-09 NOTE — Progress Notes (Signed)
Subjective:  Judy Alvarez is a 30 y.o. G3P2002 at 3264w0d being seen today for ongoing prenatal care.  She is currently monitored for the following issues for this low-risk pregnancy and has Supervision of normal pregnancy, antepartum and H/O oophorectomy on her problem list.  Patient reports occasional contractions.  Contractions: Irritability. Vag. Bleeding: None.  Movement: Present. Denies leaking of fluid.   The following portions of the patient's history were reviewed and updated as appropriate: allergies, current medications, past family history, past medical history, past social history, past surgical history and problem list. Problem list updated.  Objective:   Vitals:   01/09/16 1058  BP: 112/72  Pulse: 74  Weight: 174 lb (78.9 kg)    Fetal Status: Fetal Heart Rate (bpm): 130 Fundal Height: 37 cm Movement: Present  Presentation: Vertex  General:  Alert, oriented and cooperative. Patient is in no acute distress.  Skin: Skin is warm and dry. No rash noted.   Cardiovascular: Normal heart rate noted  Respiratory: Normal respiratory effort, no problems with respiration noted  Abdomen: Soft, gravid, appropriate for gestational age. Pain/Pressure: Present     Pelvic:  Cervical exam deferred Dilation: 1 Effacement (%): 50 Station: -3  Extremities: Normal range of motion.  Edema: None  Mental Status: Normal mood and affect. Normal behavior. Normal judgment and thought content.   Urinalysis: Urine Protein: Negative Urine Glucose: Negative  Assessment and Plan:  Pregnancy: G3P2002 at 3264w0d  1. Supervision of normal pregnancy, antepartum, third trimester - Discussed signs of labor and directions to Women's  Term labor symptoms and general obstetric precautions including but not limited to vaginal bleeding, contractions, leaking of fluid and fetal movement were reviewed in detail with the patient. Please refer to After Visit Summary for other counseling recommendations.  Return in  about 1 week (around 01/16/2016).   Eino FarberWalidah Kennith GainN Karim, CNM

## 2016-01-16 ENCOUNTER — Ambulatory Visit (INDEPENDENT_AMBULATORY_CARE_PROVIDER_SITE_OTHER): Payer: PRIVATE HEALTH INSURANCE | Admitting: Advanced Practice Midwife

## 2016-01-16 VITALS — BP 113/80 | HR 90 | Wt 175.0 lb

## 2016-01-16 DIAGNOSIS — R102 Pelvic and perineal pain: Secondary | ICD-10-CM

## 2016-01-16 DIAGNOSIS — Z3483 Encounter for supervision of other normal pregnancy, third trimester: Secondary | ICD-10-CM

## 2016-01-16 DIAGNOSIS — O26899 Other specified pregnancy related conditions, unspecified trimester: Secondary | ICD-10-CM

## 2016-01-16 NOTE — Progress Notes (Signed)
Subjective:  Judy Alvarez is a 30 y.o. G3P2002 at [redacted]w[redacted]d being seen today for ongoing prenatal care.  She is currently monitored for the following issues for this low-risk pregnancy and has Supervision of normal pregnancy, antepartum and H/O oophorectomy on her problem list.  Patient reports occasional contractions.  Contractions: Irritability. Vag. Bleeding: None.  Movement: Present. Denies leaking of fluid.   The following portions of the patient's history were reviewed and updated as appropriate: allergies, current medications, past family history, past medical history, past social history, past surgical history and problem list. Problem list updated.  Objective:   Vitals:   01/16/16 1035  BP: 113/80  Pulse: 90  Weight: 175 lb (79.4 kg)    Fetal Status: Fetal Heart Rate (bpm): 143 Fundal Height: 38 cm Movement: Present     General:  Alert, oriented and cooperative. Patient is in no acute distress.  Skin: Skin is warm and dry. No rash noted.   Cardiovascular: Normal heart rate noted  Respiratory: Normal respiratory effort, no problems with respiration noted  Abdomen: Soft, gravid, appropriate for gestational age. Pain/Pressure: Present     Pelvic:  Cervical exam performed      2.5/50/-3, scant bleeding on glove following exam  Extremities: Normal range of motion.  Edema: Trace  Mental Status: Normal mood and affect. Normal behavior. Normal judgment and thought content.   Urinalysis: Urine Protein: Negative Urine Glucose: Negative  Assessment and Plan:  Pregnancy: G3P2002 at [redacted]w[redacted]d  1. Supervision of normal pregnancy, antepartum, third trimester   2. Pelvic pain complicating pregnancy, antepartum --Likely pubic symphysis/pelvic girdle pain.  Rest/ice/heat/position changes until delivery.    Term labor symptoms and general obstetric precautions including but not limited to vaginal bleeding, contractions, leaking of fluid and fetal movement were reviewed in detail with the  patient. Please refer to After Visit Summary for other counseling recommendations.  Return in about 1 week (around 01/23/2016).   Hurshel PartyLisa A Leftwich-Kirby, CNM

## 2016-01-23 ENCOUNTER — Ambulatory Visit (INDEPENDENT_AMBULATORY_CARE_PROVIDER_SITE_OTHER): Payer: PRIVATE HEALTH INSURANCE | Admitting: Family

## 2016-01-23 VITALS — BP 115/73 | HR 77 | Wt 178.0 lb

## 2016-01-23 DIAGNOSIS — Z3483 Encounter for supervision of other normal pregnancy, third trimester: Secondary | ICD-10-CM

## 2016-01-23 NOTE — Progress Notes (Signed)
Subjective:  Judy Alvarez is a [age] y.oRoyann Alvarez. G3P2002 at 7967w0d being seen today for ongoing prenatal care.  She is currently monitored for the following issues for this low-risk pregnancy and has Supervision of normal pregnancy, antepartum and H/O oophorectomy on her problem list.  Patient reports no complaints.  Contractions: Irritability. Vag. Bleeding: None.  Movement: Present. Denies leaking of fluid.   The following portions of the patient's history were reviewed and updated as appropriate: allergies, current medications, past family history, past medical history, past social history, past surgical history and problem list. Problem list updated.  Objective:   Vitals:   01/23/16 1007  BP: 115/73  Pulse: 77  Weight: 178 lb (80.7 kg)    Fetal Status: Fetal Heart Rate (bpm): 138 Fundal Height: 37 cm Movement: Present  Presentation: Vertex  General:  Alert, oriented and cooperative. Patient is in no acute distress.  Skin: Skin is warm and dry. No rash noted.   Cardiovascular: Normal heart rate noted  Respiratory: Normal respiratory effort, no problems with respiration noted  Abdomen: Soft, gravid, appropriate for gestational age. Pain/Pressure: Present     Pelvic:  Cervical exam performed Dilation: 2 Effacement (%): 50 Station: -3  Extremities: Normal range of motion.  Edema: Trace  Mental Status: Normal mood and affect. Normal behavior. Normal judgment and thought content.   Urinalysis: Urine Protein: Negative Urine Glucose: Negative  Assessment and Plan:  Pregnancy: G3P2002 at 6667w0d  1. Supervision of normal pregnancy, antepartum, third trimester - Reviewed labor precautions - Discussed IOL policy for practice  Term labor symptoms and general obstetric precautions including but not limited to vaginal bleeding, contractions, leaking of fluid and fetal movement were reviewed in detail with the patient. Please refer to After Visit Summary for other counseling recommendations.  Return  in about 1 week (around 01/30/2016).   Judy Alvarez, CNM

## 2016-01-25 ENCOUNTER — Inpatient Hospital Stay (HOSPITAL_COMMUNITY)
Admission: AD | Admit: 2016-01-25 | Discharge: 2016-01-25 | Disposition: A | Discharge status: Home / Self Care | Payer: PRIVATE HEALTH INSURANCE | Source: Ambulatory Visit | Attending: Obstetrics and Gynecology | Admitting: Obstetrics and Gynecology

## 2016-01-25 ENCOUNTER — Inpatient Hospital Stay (HOSPITAL_COMMUNITY): Payer: PRIVATE HEALTH INSURANCE | Admitting: Anesthesiology

## 2016-01-25 ENCOUNTER — Inpatient Hospital Stay (HOSPITAL_COMMUNITY)
Admission: AD | Admit: 2016-01-25 | Discharge: 2016-01-27 | DRG: 775 | Disposition: A | Discharge status: Home / Self Care | Payer: PRIVATE HEALTH INSURANCE | Source: Ambulatory Visit | Attending: Obstetrics and Gynecology | Admitting: Obstetrics and Gynecology

## 2016-01-25 ENCOUNTER — Encounter (HOSPITAL_COMMUNITY): Payer: Self-pay | Admitting: *Deleted

## 2016-01-25 DIAGNOSIS — Z833 Family history of diabetes mellitus: Secondary | ICD-10-CM | POA: Diagnosis not present

## 2016-01-25 DIAGNOSIS — Z3A39 39 weeks gestation of pregnancy: Secondary | ICD-10-CM | POA: Diagnosis not present

## 2016-01-25 DIAGNOSIS — Z3483 Encounter for supervision of other normal pregnancy, third trimester: Secondary | ICD-10-CM

## 2016-01-25 DIAGNOSIS — O9962 Diseases of the digestive system complicating childbirth: Secondary | ICD-10-CM | POA: Diagnosis present

## 2016-01-25 DIAGNOSIS — IMO0001 Reserved for inherently not codable concepts without codable children: Secondary | ICD-10-CM

## 2016-01-25 DIAGNOSIS — K219 Gastro-esophageal reflux disease without esophagitis: Secondary | ICD-10-CM | POA: Diagnosis present

## 2016-01-25 LAB — CBC
HCT: 33.8 % — ABNORMAL LOW (ref 36.0–46.0)
Hemoglobin: 11.1 g/dL — ABNORMAL LOW (ref 12.0–15.0)
MCH: 28.5 pg (ref 26.0–34.0)
MCHC: 32.8 g/dL (ref 30.0–36.0)
MCV: 86.9 fL (ref 78.0–100.0)
PLATELETS: 209 10*3/uL (ref 150–400)
RBC: 3.89 MIL/uL (ref 3.87–5.11)
RDW: 14.1 % (ref 11.5–15.5)
WBC: 12.1 10*3/uL — AB (ref 4.0–10.5)

## 2016-01-25 LAB — TYPE AND SCREEN
ABO/RH(D): A POS
ANTIBODY SCREEN: NEGATIVE

## 2016-01-25 MED ORDER — OXYTOCIN 40 UNITS IN LACTATED RINGERS INFUSION - SIMPLE MED: 2.5000 [IU]/h | INTRAVENOUS | Status: DC

## 2016-01-25 MED ORDER — LIDOCAINE HCL (PF) 1 % IJ SOLN
30.0000 mL | INTRAMUSCULAR | Status: DC | PRN
  Filled 2016-01-25: qty 30

## 2016-01-25 MED ORDER — OXYTOCIN 10 UNIT/ML IJ SOLN: 10.0000 [IU] | Freq: Once | INTRAMUSCULAR | Status: DC

## 2016-01-25 MED ORDER — FLEET ENEMA 7-19 GM/118ML RE ENEM: 1.0000 | ENEMA | RECTAL | Status: DC | PRN

## 2016-01-25 MED ORDER — PHENYLEPHRINE 40 MCG/ML (10ML) SYRINGE FOR IV PUSH (FOR BLOOD PRESSURE SUPPORT)
80.0000 ug | PREFILLED_SYRINGE | INTRAVENOUS | Status: DC | PRN
  Filled 2016-01-25: qty 5
  Filled 2016-01-25: qty 10

## 2016-01-25 MED ORDER — SODIUM CHLORIDE 0.9% FLUSH: 3.0000 mL | INTRAVENOUS | Status: DC | PRN

## 2016-01-25 MED ORDER — SOD CITRATE-CITRIC ACID 500-334 MG/5ML PO SOLN: 30.0000 mL | ORAL | Status: DC | PRN

## 2016-01-25 MED ORDER — OXYCODONE-ACETAMINOPHEN 5-325 MG PO TABS: 2.0000 | ORAL_TABLET | ORAL | Status: DC | PRN

## 2016-01-25 MED ORDER — SODIUM CHLORIDE 0.9 % IV SOLN: 250.0000 mL | INTRAVENOUS | Status: DC | PRN

## 2016-01-25 MED ORDER — FENTANYL 2.5 MCG/ML BUPIVACAINE 1/10 % EPIDURAL INFUSION (WH - ANES)
14.0000 mL/h | INTRAMUSCULAR | Status: DC | PRN
  Administered 2016-01-25: 14 mL/h via EPIDURAL

## 2016-01-25 MED ORDER — ONDANSETRON HCL 4 MG/2ML IJ SOLN: 4.0000 mg | Freq: Four times a day (QID) | INTRAMUSCULAR | Status: DC | PRN

## 2016-01-25 MED ORDER — FENTANYL 2.5 MCG/ML BUPIVACAINE 1/10 % EPIDURAL INFUSION (WH - ANES)
INTRAMUSCULAR | Status: AC
  Filled 2016-01-25: qty 125

## 2016-01-25 MED ORDER — LIDOCAINE HCL (PF) 1 % IJ SOLN: 30.0000 mL | INTRAMUSCULAR | Status: DC | PRN

## 2016-01-25 MED ORDER — OXYTOCIN BOLUS FROM INFUSION: 500.0000 mL | Freq: Once | INTRAVENOUS | Status: DC

## 2016-01-25 MED ORDER — ACETAMINOPHEN 325 MG PO TABS: 650.0000 mg | ORAL_TABLET | ORAL | Status: DC | PRN

## 2016-01-25 MED ORDER — FENTANYL CITRATE (PF) 100 MCG/2ML IJ SOLN: 50.0000 ug | INTRAMUSCULAR | Status: DC | PRN

## 2016-01-25 MED ORDER — HYDROXYZINE HCL 50 MG PO TABS
50.0000 mg | ORAL_TABLET | Freq: Four times a day (QID) | ORAL | Status: DC | PRN
  Filled 2016-01-25: qty 1

## 2016-01-25 MED ORDER — PHENYLEPHRINE 40 MCG/ML (10ML) SYRINGE FOR IV PUSH (FOR BLOOD PRESSURE SUPPORT)
80.0000 ug | PREFILLED_SYRINGE | INTRAVENOUS | Status: DC | PRN
  Filled 2016-01-25: qty 5

## 2016-01-25 MED ORDER — LIDOCAINE HCL (PF) 1 % IJ SOLN
INTRAMUSCULAR | Status: DC | PRN
  Administered 2016-01-25: 6 mL via EPIDURAL
  Administered 2016-01-25: 4 mL via EPIDURAL

## 2016-01-25 MED ORDER — EPHEDRINE 5 MG/ML INJ
10.0000 mg | INTRAVENOUS | Status: DC | PRN
  Filled 2016-01-25: qty 4

## 2016-01-25 MED ORDER — LACTATED RINGERS IV SOLN: INTRAVENOUS | Status: DC

## 2016-01-25 MED ORDER — SODIUM CHLORIDE 0.9% FLUSH: 3.0000 mL | Freq: Two times a day (BID) | INTRAVENOUS | Status: DC

## 2016-01-25 MED ORDER — LACTATED RINGERS IV SOLN: 500.0000 mL | INTRAVENOUS | Status: DC | PRN

## 2016-01-25 MED ORDER — LACTATED RINGERS IV SOLN
INTRAVENOUS | Status: DC
  Administered 2016-01-25: 22:00:00 via INTRAVENOUS

## 2016-01-25 MED ORDER — OXYCODONE-ACETAMINOPHEN 5-325 MG PO TABS: 1.0000 | ORAL_TABLET | ORAL | Status: DC | PRN

## 2016-01-25 MED ORDER — DIPHENHYDRAMINE HCL 50 MG/ML IJ SOLN: 12.5000 mg | INTRAMUSCULAR | Status: DC | PRN

## 2016-01-25 MED ORDER — LACTATED RINGERS IV SOLN: 500.0000 mL | Freq: Once | INTRAVENOUS | Status: DC

## 2016-01-25 NOTE — Anesthesia Procedure Notes (Signed)
Epidural Patient location during procedure: OB Start time: 01/25/2016 10:44 PM End time: 01/25/2016 10:48 PM  Staffing Anesthesiologist: Linton RumpALLAN, Janyla Biscoe DICKERSON Performed: anesthesiologist   Preanesthetic Checklist Completed: patient identified, surgical consent, pre-op evaluation, timeout performed, IV checked, risks and benefits discussed and monitors and equipment checked  Epidural Patient position: sitting Prep: site prepped and draped and DuraPrep Patient monitoring: continuous pulse ox and blood pressure Approach: midline Location: L1-L2 Injection technique: LOR air  Needle:  Needle type: Tuohy  Needle gauge: 17 G Needle length: 9 cm and 9 Needle insertion depth: 4 cm Catheter type: closed end flexible Catheter size: 19 Gauge Catheter at skin depth: 9 cm Test dose: negative  Assessment Events: blood not aspirated, injection not painful, no injection resistance, negative IV test and no paresthesia  Additional Notes Reason for block:procedure for pain

## 2016-01-25 NOTE — Anesthesia Preprocedure Evaluation (Signed)
Anesthesia Evaluation  Patient identified by MRN, date of birth, ID band Patient awake    Reviewed: Allergy & Precautions, NPO status , Patient's Chart, lab work & pertinent test results  History of Anesthesia Complications Negative for: history of anesthetic complications  Airway Mallampati: II  TM Distance: >3 FB Neck ROM: Full    Dental  (+) Teeth Intact   Pulmonary neg pulmonary ROS,    Pulmonary exam normal breath sounds clear to auscultation       Cardiovascular (-) hypertensionnegative cardio ROS   Rhythm:Regular Rate:Normal     Neuro/Psych negative neurological ROS     GI/Hepatic Neg liver ROS, GERD  ,  Endo/Other  negative endocrine ROS  Renal/GU negative Renal ROS     Musculoskeletal   Abdominal   Peds  Hematology  (+) anemia ,   Anesthesia Other Findings   Reproductive/Obstetrics (+) Pregnancy                             Anesthesia Physical Anesthesia Plan  ASA: II  Anesthesia Plan: Epidural   Post-op Pain Management:    Induction:   Airway Management Planned:   Additional Equipment:   Intra-op Plan:   Post-operative Plan:   Informed Consent: I have reviewed the patients History and Physical, chart, labs and discussed the procedure including the risks, benefits and alternatives for the proposed anesthesia with the patient or authorized representative who has indicated his/her understanding and acceptance.     Plan Discussed with:   Anesthesia Plan Comments: (I have discussed risks of neuraxial anesthesia including but not limited to infection, bleeding, nerve injury, back pain, headache, seizures, and failure of block. Patient denies bleeding disorders and is not currently anticoagulated. Labs have been reviewed. Risks and benefits discussed. All patient's questions answered.   Hgb 11.1 Hct 33.8 Platelets 209)        Anesthesia Quick Evaluation

## 2016-01-25 NOTE — H&P (Signed)
Judy ShiversJessica Silberstein is a 30 y.o. female G3P2002 @39 .2 wks  presenting for ctx's since 1900. Denies ROM, vag bleeding. GBS neg.  OB History    Gravida Para Term Preterm AB Living   3 2 2     2    SAB TAB Ectopic Multiple Live Births           2     Past Medical History:  Diagnosis Date  . Anemia    Past Surgical History:  Procedure Laterality Date  . APPENDECTOMY    . 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 Social History:  reports that she has never smoked. She has never used smokeless tobacco. She reports that she does not drink alcohol or use drugs.     Maternal Diabetes: No Genetic Screening: Normal Maternal Ultrasounds/Referrals: Normal Fetal Ultrasounds or other Referrals:  None Maternal Substance Abuse:  No Significant Maternal Medications:  None Significant Maternal Lab Results:  None Other Comments:  None  Review of Systems  Constitutional: Negative.   HENT: Negative.   Eyes: Negative.   Respiratory: Negative.   Cardiovascular: Negative.   Gastrointestinal: Positive for abdominal pain.  Genitourinary: Negative.   Musculoskeletal: Positive for back pain.  Skin: Negative.   Neurological: Negative.   Endo/Heme/Allergies: Negative.   Psychiatric/Behavioral: Negative.    Maternal Medical History:  Reason for admission: Contractions.   Contractions: Onset was 6-12 hours ago.   Frequency: regular.   Perceived severity is moderate.    Fetal activity: Perceived fetal activity is normal.   Last perceived fetal movement was within the past hour.    Prenatal complications: no prenatal complications Prenatal Complications - Diabetes: none.    Dilation: 4.5 Effacement (%): 80 Station: -2 Exam by:: K. weissRN Blood pressure 135/83, pulse 101, resp. rate 16, height 5\' 2"  (1.575 m), weight 178 lb (80.7 kg), last menstrual period 04/25/2015. Maternal Exam:  Uterine Assessment:  Contraction strength is moderate.  Contraction frequency is regular.   Abdomen: Patient reports no abdominal tenderness. Estimated fetal weight is 7-5.   Fetal presentation: vertex  Introitus: Normal vulva. Normal vagina.  Ferning test: not done.  Nitrazine test: not done. Amniotic fluid character: not assessed.  Pelvis: adequate for delivery.   Cervix: Cervix evaluated by digital exam.     Fetal Exam Fetal Monitor Review: Mode: ultrasound.   Baseline rate: 120's.  Variability: moderate (6-25 bpm).   Pattern: accelerations present and no decelerations.    Fetal State Assessment: Category I - tracings are normal.     Physical Exam  Constitutional: She is oriented to person, place, and time. She appears well-developed and well-nourished.  HENT:  Head: Normocephalic.  Eyes: Pupils are equal, round, and reactive to light.  Neck: Normal range of motion.  Cardiovascular: Normal rate, regular rhythm and normal heart sounds.   Respiratory: Effort normal and breath sounds normal.  GI: Soft. Bowel sounds are normal.  Genitourinary: Vagina normal and uterus normal.  Musculoskeletal: Normal range of motion.  Neurological: She is alert and oriented to person, place, and time. She has normal reflexes.  Skin: Skin is warm and dry.  Psychiatric: She has a normal mood and affect. Her behavior is normal. Judgment and thought content normal.    Prenatal labs: ABO, Rh: A/POS/-- (01/20 16100939) Antibody: NEG (01/20 0939) Rubella: 1.80 (01/20 0939) RPR: NON REAC (06/05 1341)  HBsAg: NEGATIVE (01/20 0939)  HIV: NONREACTIVE (  06/05 1341)  GBS: Negative (08/04 0000)   Assessment/Plan: Pregnancy @39 .2 wks, G3P2002, spontaneous onset of labor, GBS neg; admit to L & D   Wyvonnia Dusky 01/25/2016, 10:01 PM

## 2016-01-25 NOTE — MAU Note (Signed)
Contraction since this morning loosing mucous plug

## 2016-01-26 ENCOUNTER — Encounter (HOSPITAL_COMMUNITY): Payer: Self-pay | Admitting: *Deleted

## 2016-01-26 DIAGNOSIS — Z3A39 39 weeks gestation of pregnancy: Secondary | ICD-10-CM | POA: Diagnosis not present

## 2016-01-26 LAB — ABO/RH: ABO/RH(D): A POS

## 2016-01-26 LAB — SYPHILIS: RPR W/REFLEX TO RPR TITER AND TREPONEMAL ANTIBODIES, TRADITIONAL SCREENING AND DIAGNOSIS ALGORITHM: RPR Ser Ql: NONREACTIVE

## 2016-01-26 MED ORDER — DIBUCAINE 1 % RE OINT: 1.0000 "application " | TOPICAL_OINTMENT | RECTAL | Status: DC | PRN

## 2016-01-26 MED ORDER — SENNOSIDES-DOCUSATE SODIUM 8.6-50 MG PO TABS
2.0000 | ORAL_TABLET | ORAL | Status: DC
  Administered 2016-01-26: 2 via ORAL
  Filled 2016-01-26: qty 2

## 2016-01-26 MED ORDER — TETANUS-DIPHTH-ACELL PERTUSSIS 5-2.5-18.5 LF-MCG/0.5 IM SUSP: 0.5000 mL | Freq: Once | INTRAMUSCULAR | Status: DC

## 2016-01-26 MED ORDER — BENZOCAINE-MENTHOL 20-0.5 % EX AERO
1.0000 "application " | INHALATION_SPRAY | CUTANEOUS | Status: DC | PRN
  Administered 2016-01-26 – 2016-01-27 (×2): 1 via TOPICAL
  Filled 2016-01-26: qty 56

## 2016-01-26 MED ORDER — DIPHENHYDRAMINE HCL 25 MG PO CAPS: 25.0000 mg | ORAL_CAPSULE | Freq: Four times a day (QID) | ORAL | Status: DC | PRN

## 2016-01-26 MED ORDER — WITCH HAZEL-GLYCERIN EX PADS: 1.0000 "application " | MEDICATED_PAD | CUTANEOUS | Status: DC | PRN

## 2016-01-26 MED ORDER — OXYTOCIN 40 UNITS IN LACTATED RINGERS INFUSION - SIMPLE MED
1.0000 m[IU]/min | INTRAVENOUS | Status: DC
  Administered 2016-01-26: 2 m[IU]/min via INTRAVENOUS
  Filled 2016-01-26: qty 1000

## 2016-01-26 MED ORDER — ACETAMINOPHEN 325 MG PO TABS: 650.0000 mg | ORAL_TABLET | ORAL | Status: DC | PRN

## 2016-01-26 MED ORDER — ONDANSETRON HCL 4 MG PO TABS: 4.0000 mg | ORAL_TABLET | ORAL | Status: DC | PRN

## 2016-01-26 MED ORDER — TERBUTALINE SULFATE 1 MG/ML IJ SOLN
0.2500 mg | Freq: Once | INTRAMUSCULAR | Status: DC | PRN
  Filled 2016-01-26: qty 1

## 2016-01-26 MED ORDER — PRENATAL MULTIVITAMIN CH
1.0000 | ORAL_TABLET | Freq: Every day | ORAL | Status: DC
  Administered 2016-01-26 – 2016-01-27 (×2): 1 via ORAL
  Filled 2016-01-26 (×2): qty 1

## 2016-01-26 MED ORDER — COCONUT OIL OIL: 1.0000 "application " | TOPICAL_OIL | Status: DC | PRN

## 2016-01-26 MED ORDER — SIMETHICONE 80 MG PO CHEW: 80.0000 mg | CHEWABLE_TABLET | ORAL | Status: DC | PRN

## 2016-01-26 MED ORDER — IBUPROFEN 600 MG PO TABS
600.0000 mg | ORAL_TABLET | Freq: Four times a day (QID) | ORAL | Status: DC
  Administered 2016-01-26 – 2016-01-27 (×5): 600 mg via ORAL
  Filled 2016-01-26 (×5): qty 1

## 2016-01-26 MED ORDER — ONDANSETRON HCL 4 MG/2ML IJ SOLN: 4.0000 mg | INTRAMUSCULAR | Status: DC | PRN

## 2016-01-26 MED ORDER — ZOLPIDEM TARTRATE 5 MG PO TABS: 5.0000 mg | ORAL_TABLET | Freq: Every evening | ORAL | Status: DC | PRN

## 2016-01-26 NOTE — Progress Notes (Signed)
Royann ShiversJessica Killam is a 30 y.o. G3P2002 at 7121w3d by ultrasound admitted for active labor  Subjective:   Objective: BP 117/71   Pulse 93   Temp 98.2 F (36.8 C) (Oral)   Resp 18   Ht 5\' 2"  (1.575 m)   Wt 178 lb (80.7 kg)   LMP 04/25/2015 (Exact Date)   SpO2 100%   BMI 32.56 kg/m  No intake/output data recorded. No intake/output data recorded.  FHT:  FHR: 120-130 bpm, variability: moderate,  accelerations:  Present,  decelerations:  Absent UC:   regular, every 2-4 minutes SVE:   Dilation: 7 Effacement (%): 80 Station: -2 Exam by:: JDaley  Labs: Lab Results  Component Value Date   WBC 12.1 (H) 01/25/2016   HGB 11.1 (L) 01/25/2016   HCT 33.8 (L) 01/25/2016   MCV 86.9 01/25/2016   PLT 209 01/25/2016    Assessment / Plan: Spontaneous labor, progressing normally  Labor: Progressing normally Preeclampsia:  no signs or symptoms of toxicity and intake and ouput balanced Fetal Wellbeing:  Category I Pain Control:  Epidural I/D:  n/a Anticipated MOD:  NSVD  Wyvonnia DuskyMarie Lawson 01/26/2016, 1:23 AM

## 2016-01-26 NOTE — Lactation Note (Signed)
This note was copied from a baby's chart. Lactation Consultation Note: Lactation brochure given to mother with review of basics. Mother is an experienced breastfeeding mother with two other children. Mother states that infant had has 2 good feeding. She denies having any concerns. She states that infant just latched on without difficulty. Mother advised that cluster feeding is normal for newborns the first several day. Mother informed of outpatient services , BFSG"S and phone assistance. Mother receptive to all teaching.   Patient Name: Judy Alvarez ZOXWR'UToday's Date: 01/26/2016 Reason for consult: Initial assessment   Maternal Data Has patient been taught Hand Expression?: Yes Does the patient have breastfeeding experience prior to this delivery?: Yes  Feeding    LATCH Score/Interventions                      Lactation Tools Discussed/Used     Consult Status Consult Status: Follow-up Date: 01/27/16 Follow-up type: In-patient    Stevan BornKendrick, [facility] 01/26/2016, 1:45 PM

## 2016-01-26 NOTE — Anesthesia Postprocedure Evaluation (Signed)
Anesthesia Post Note  Patient: Judy ShiversJessica Alvarez  Procedure(s) Performed: * No procedures listed *  Patient location during evaluation: Mother Baby Anesthesia Type: Epidural Level of consciousness: awake and alert and oriented Pain management: satisfactory to patient Vital Signs Assessment: post-procedure vital signs reviewed and stable Respiratory status: spontaneous breathing and nonlabored ventilation Cardiovascular status: stable Postop Assessment: no headache, no backache, no signs of nausea or vomiting, adequate PO intake and patient able to bend at knees (patient up walking) Anesthetic complications: no     Last Vitals:  Vitals:   01/26/16 0830 01/26/16 0940  BP: 108/64 (!) 103/54  Pulse: 77 71  Resp:    Temp: 36.1 C 36.6 C    Last Pain:  Vitals:   01/26/16 1116  TempSrc:   PainSc: 4    Pain Goal:                 Madison HickmanGREGORY,Vermell [place]

## 2016-01-27 LAB — CBC
HCT: 31.2 % — ABNORMAL LOW (ref 36.0–46.0)
Hemoglobin: 10.9 g/dL — ABNORMAL LOW (ref 12.0–15.0)
MCH: 30.4 pg (ref 26.0–34.0)
MCHC: 34.9 g/dL (ref 30.0–36.0)
MCV: 87.2 fL (ref 78.0–100.0)
PLATELETS: 185 10*3/uL (ref 150–400)
RBC: 3.58 MIL/uL — ABNORMAL LOW (ref 3.87–5.11)
RDW: 14.3 % (ref 11.5–15.5)
WBC: 10.6 10*3/uL — ABNORMAL HIGH (ref 4.0–10.5)

## 2016-01-27 MED ORDER — IBUPROFEN 600 MG PO TABS: 600.0000 mg | ORAL_TABLET | Freq: Four times a day (QID) | ORAL | 0 refills | Status: DC

## 2016-01-27 NOTE — Discharge Instructions (Signed)

## 2016-01-27 NOTE — Plan of Care (Signed)
Problem: Pain Management: Goal: General experience of comfort will improve and pain level will decrease Outcome: Completed/Met Date Met: 01/27/16 Mother having increased uterine cramping, especially with breastfeeding. Ibuprofen is managing her discomfort.

## 2016-01-27 NOTE — Discharge Summary (Signed)
OB Discharge Summary     Patient Name: Judy ShiversJessica Steury DOB: 1985/08/22 MRN: [id number]  Date of admission: 01/25/2016 Delivering MD: Zerita BoersLAWSON, DARLENE   Date of discharge: 01/27/2016  Admitting diagnosis: 39 WKS, CTXS Intrauterine pregnancy: 4448w3d     Secondary diagnosis:  Active Problems:   Active labor at term  Additional problems: None     Discharge diagnosis: Term Pregnancy Delivered                                                                                                Post partum procedures:n/a  Augmentation: n/a  Complications: None  Hospital course:  Onset of Labor With Vaginal Delivery     30 y.o. yo G3P3003 at 6348w3d was admitted in Active Labor on 01/25/2016. Patient had an uncomplicated labor course as follows:  Membrane Rupture Time/Date: 6:30 AM ,01/26/2016   Intrapartum Procedures: Episiotomy: None [1]                                         Lacerations:  2nd degree [3]  Patient had a delivery of a Viable infant. 01/26/2016  Information for the patient's newborn:  Shela CommonsJarriel, Boy Yaslin [[id number]][[id number]]  Delivery Method: Vag-Spont    Pateint had an uncomplicated postpartum course.  She is ambulating, tolerating a regular diet, passing flatus, and urinating well. Patient is discharged home in stable condition on 01/27/16.    Physical exam Vitals:   01/26/16 0830 01/26/16 0940 01/26/16 1600 01/27/16 0530  BP: 108/64 (!) 103/54 108/70 118/81  Pulse: 77 71 68 74  Resp:   20 16  Temp: 97 F (36.1 C) 97.8 F (36.6 C) 98 F (36.7 C) 98.3 F (36.8 C)  TempSrc: Oral Oral Oral Oral  SpO2:      Weight:      Height:       General: alert, cooperative and no distress Lochia: appropriate Uterine Fundus: firm Incision: N/A DVT Evaluation: No evidence of DVT seen on physical exam. Labs: Lab Results  Component Value Date   WBC 10.6 (H) 01/27/2016   HGB 10.9 (L) 01/27/2016   HCT 31.2 (L) 01/27/2016   MCV 87.2 01/27/2016   PLT 185 01/27/2016   No flowsheet  data found.  Discharge instruction: per After Visit Summary and "Baby and Me Booklet".  After visit meds:    Medication List    STOP taking these medications   Doxylamine-Pyridoxine 10-10 MG Tbec Commonly known as:  DICLEGIS   Medical Compression Pantyhose Misc     TAKE these medications   ibuprofen 600 MG tablet Commonly known as:  ADVIL,MOTRIN Take 1 tablet (600 mg total) by mouth every 6 (six) hours.   PRENATAL VITAMINS PO Take 1 tablet by mouth daily. Reported on 10/10/2015       Diet: routine diet  Activity: Advance as tolerated. Pelvic rest for 6 weeks.   Outpatient follow up:6 weeks Follow up Appt:Future Appointments Date Time Provider Department Center  01/30/2016 9:50 AM Dorathy KinsmanVirginia Smith, CNM [facility]  Follow up Visit:No Follow-up on file.  Postpartum contraception: Progesterone only pills  Newborn Data: Live born female  Birth Weight: 7 lb 1.9 oz (3230 g) APGAR: 8, 8  Baby Feeding: Breast Disposition:home with mother   01/27/2016 LEFTWICH-KIRBY, Misty Stanley, CNM

## 2016-01-27 NOTE — Plan of Care (Signed)
Problem: Bowel/Gastric: Goal: Gastrointestinal status will improve Outcome: Completed/Met Date Met: 01/27/16 Patient has had a bowel movement post delivery.

## 2016-01-30 ENCOUNTER — Encounter: Payer: PRIVATE HEALTH INSURANCE | Admitting: Advanced Practice Midwife

## 2016-03-12 ENCOUNTER — Encounter: Payer: Self-pay | Admitting: Family

## 2016-03-12 ENCOUNTER — Ambulatory Visit (INDEPENDENT_AMBULATORY_CARE_PROVIDER_SITE_OTHER): Payer: Self-pay | Admitting: Family

## 2016-03-12 DIAGNOSIS — Z3480 Encounter for supervision of other normal pregnancy, unspecified trimester: Secondary | ICD-10-CM

## 2016-03-12 MED ORDER — NORGESTIMATE-ETH ESTRADIOL 0.25-35 MG-MCG PO TABS: 1.0000 | ORAL_TABLET | Freq: Every day | ORAL | 11 refills | Status: DC

## 2016-03-12 NOTE — Progress Notes (Signed)
Post Partum Exam  Judy ShiversJessica Hach is a 30 y.o. 453P3003 female who presents for a postpartum visit. She is 6 weeks postpartum following a vaginal delivery. I have fully reviewed the prenatal and intrapartum course. The delivery was at 39 gestational weeks.  Anesthesia: epidural Postpartum course has been unremarkable Baby's course has been unremarkable. Baby is feeding by breast. Bleeding: pt was bleeding for 1 1/2 weeks. Bowel function is normal. Bladder function is normal. Patient is not sexually active. Contraception method is none. Postpartum depression screening:neg (score 4)  The following portions of the patient's history were reviewed and updated as appropriate: allergies, current medications, past family history, past medical history, past social history, past surgical history and problem list.  Review of Systems Pertinent items are noted in HPI.   Objective:    BP 116/78 mmHg  Pulse 78  Resp 16  Ht 5\' 5"  (1.651 m)  Wt 211 lb (95.709 kg)  BMI 35.11 kg/m2  Breastfeeding? Yes   General:  alert, cooperative and appears stated age   Breasts:  inspection negative, no nipple discharge or bleeding, no masses or nodularity palpable  Lungs: clear to auscultation bilaterally  Heart:  regular rate and rhythm, S1, S2 normal, no murmur, click, rub or gallop  Abdomen: soft, non-tender; bowel sounds normal; no masses,  no organomegaly   Vulva:  normal  Vagina: normal vagina, no discharge, exudate, lesion, or erythema; healed well  Cervix:  no cervical motion tenderness  Corpus: normal size, contour, position, consistency, mobility, non-tender  Adnexa:  normal adnexa  Rectal Exam: Not performed.    Assessment:    Normal postpartum exam. Pap smear not done at today's visit.   Plan:    1. Contraception: OCP (estrogen/progesterone) 2. Pap smear in one year 3. Follow up ias needed.   Eino FarberWalidah Kennith GainN Karim, CNM

## 2017-11-11 IMAGING — US US OB COMP LESS 14 WK
1 series · 14 of 28 positions shown · non-contrast
Comparison: None.

CLINICAL DATA: Left-sided pelvic pain. Gestational age by LMP of 8
weeks 0 days. Past surgical history of left oophorectomy.

EXAM:
OBSTETRIC <14 WK ULTRASOUND
TECHNIQUE: Transabdominal ultrasound was performed for evaluation of the
gestation as well as the maternal uterus and adnexal regions.

[Series 1: us ob comp less 14 wk · 0.18mm/px · 14 of 42 slices shown]
[im 2/42]
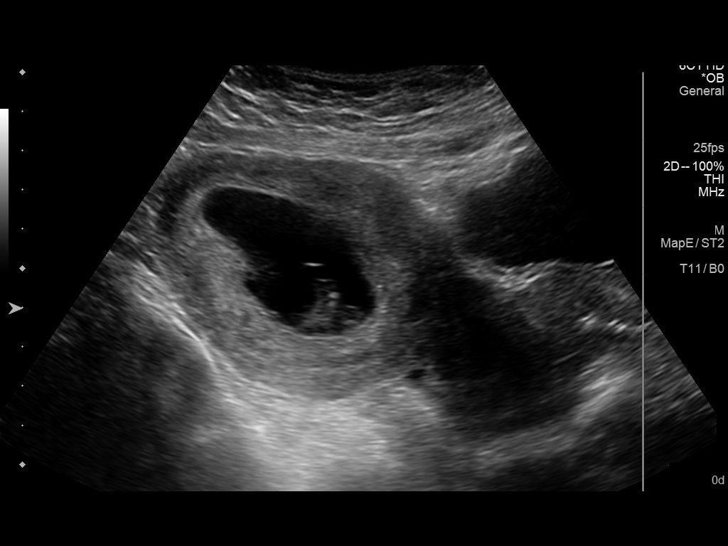
[im 5/42]
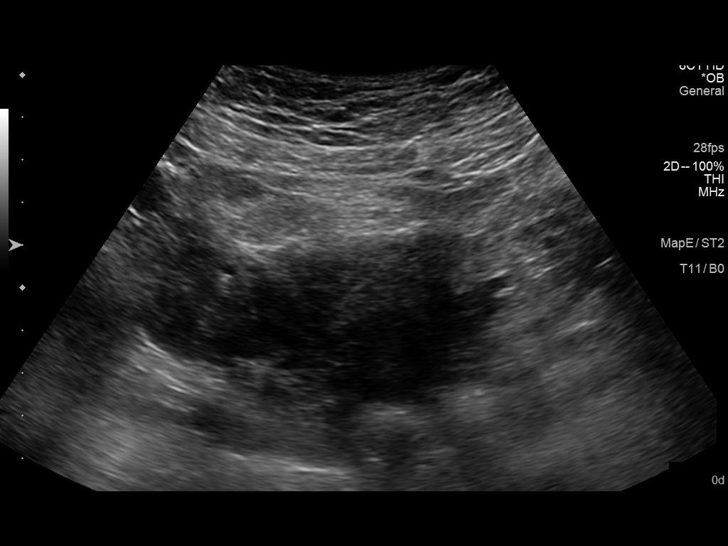
[im 8/42]
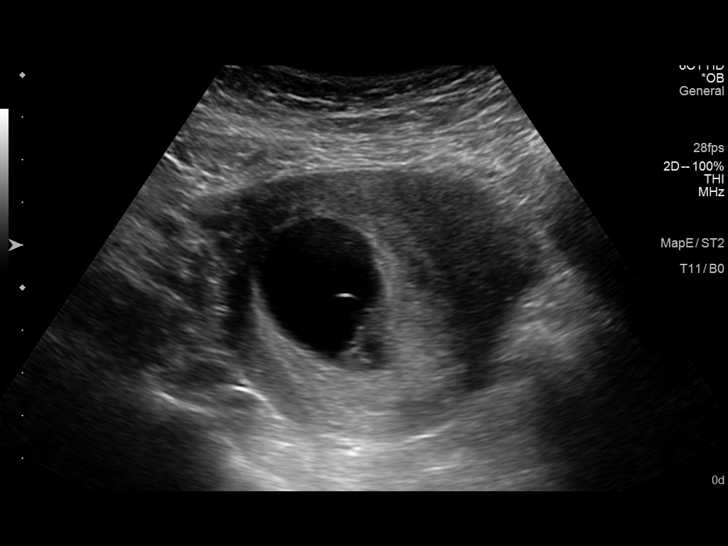
[im 11/42]
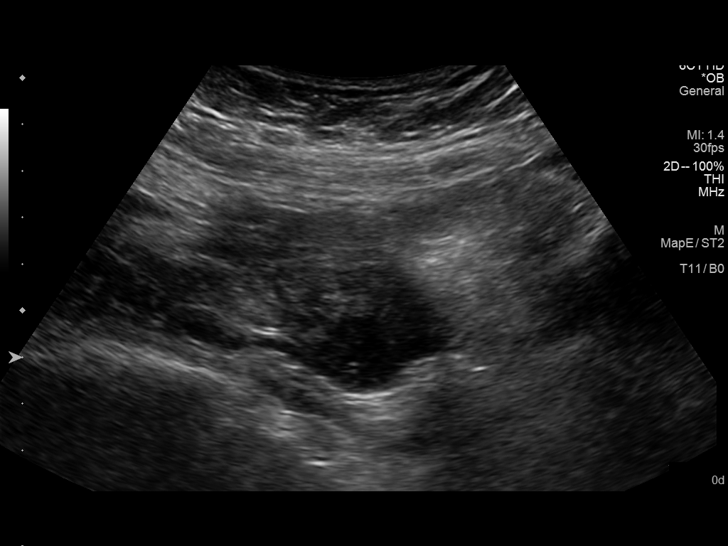
[im 14/42]
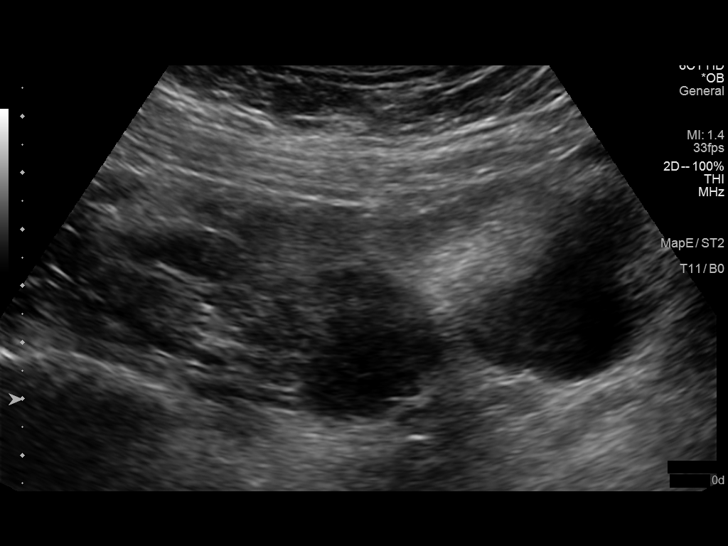
[im 17/42]
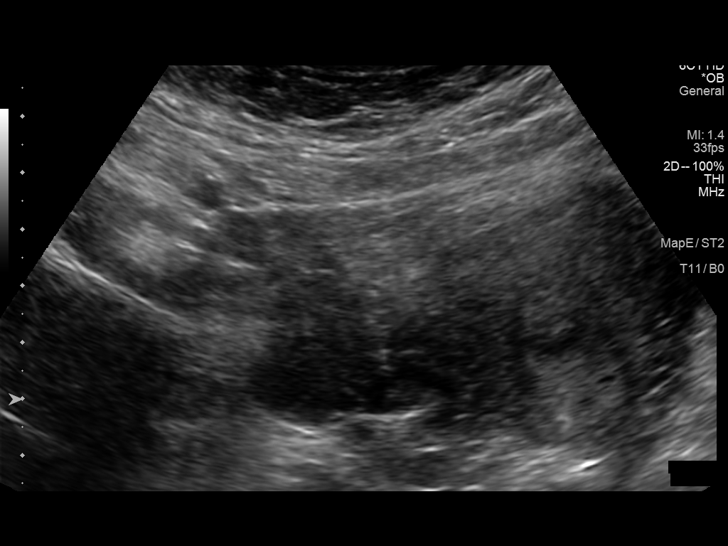
[im 20/42]
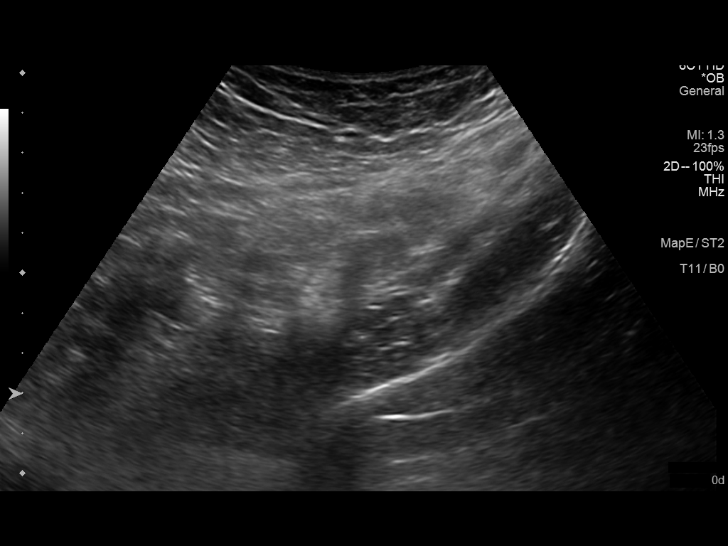
[im 23/42]
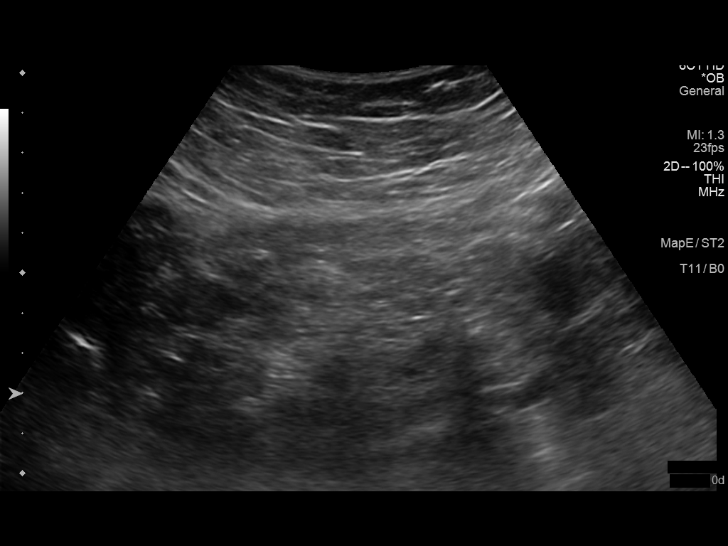
[im 26/42]
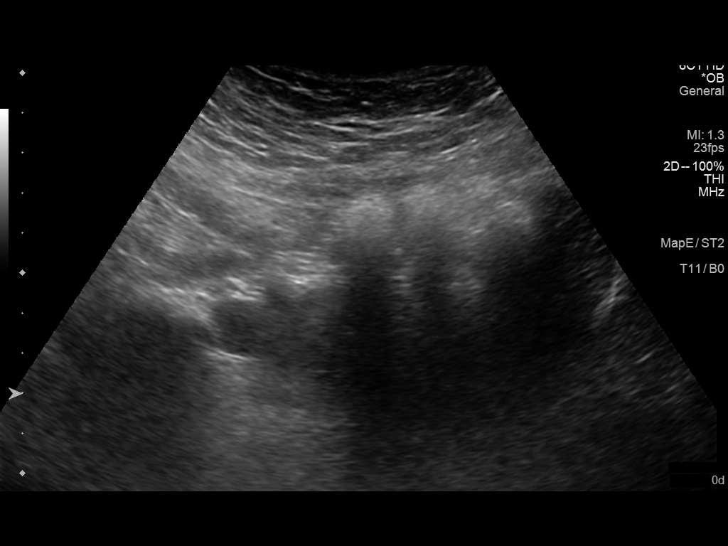
[im 29/42]
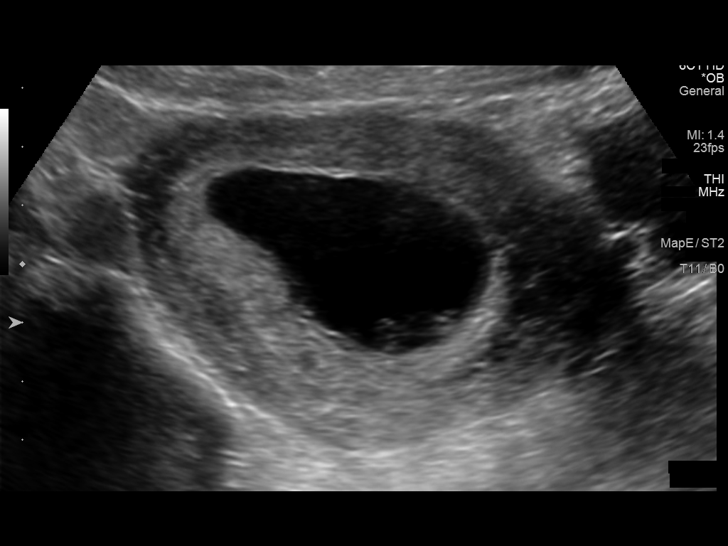
[im 32/42]
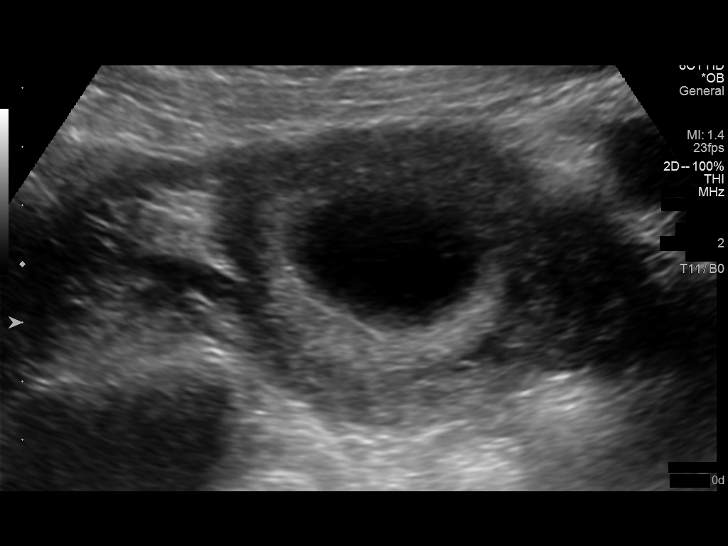
[im 35/42]
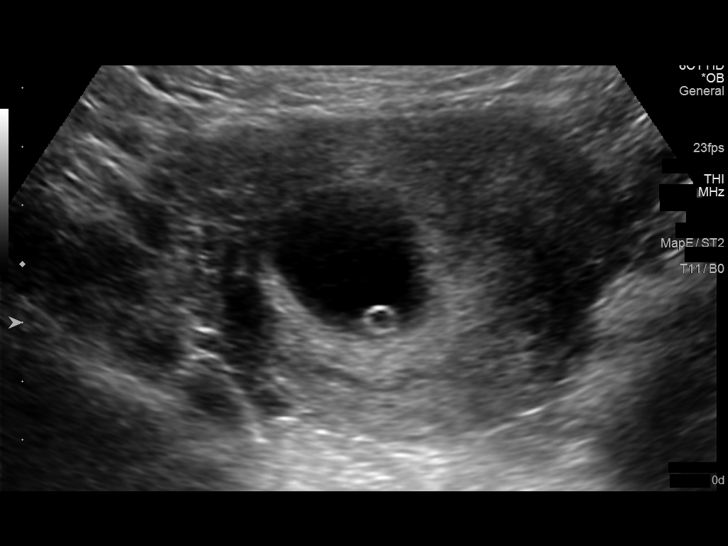
[im 38/42]
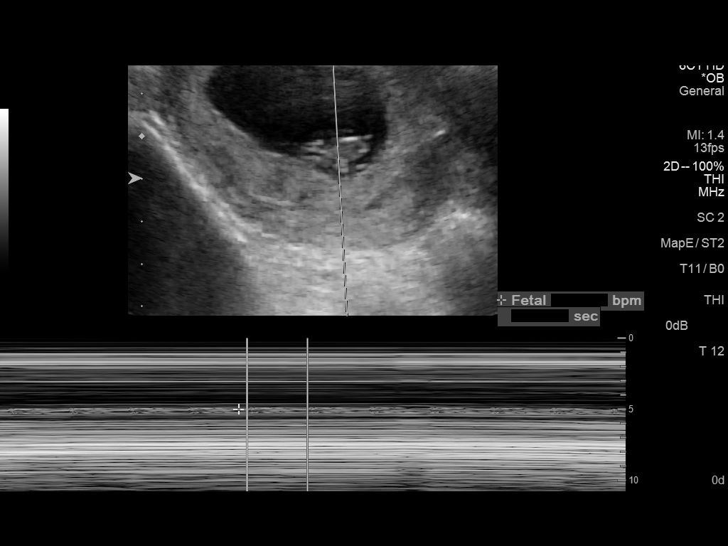
[im 42/42]
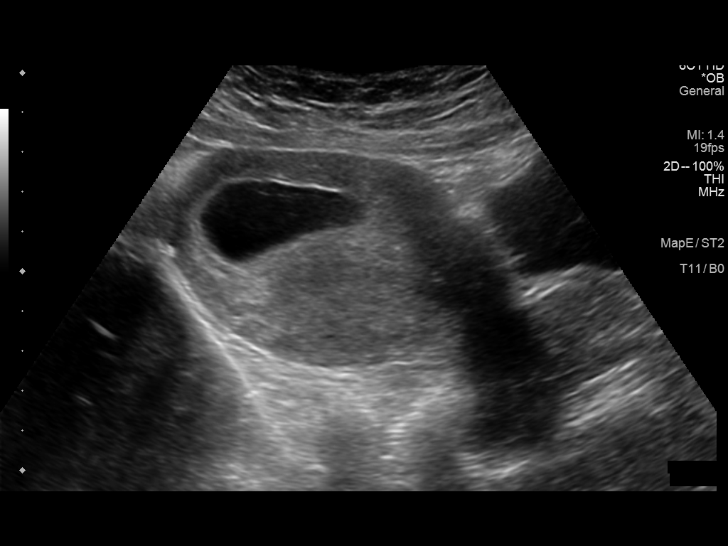

[14 of 28 positions shown; findings below may reference images not displayed]

FINDINGS: Intrauterine gestational sac: Visualized/normal in shape.

Yolk sac:  Visualized

Embryo:  Visualized

Cardiac Activity: Visualized

Heart Rate: 169 bpm

CRL:   16  mm   8 w 0 d                  US EDC: 01/30/2016

Subchorionic hemorrhage:  None visualized.

Maternal uterus/adnexae: Normal appearance of right ovary. Left
ovary is surgically absent by history. No mass or free fluid
identified.
IMPRESSION: Single living IUP measuring 8 weeks 0 days with US EDC of
01/30/2016. This is concordant with LMP.

No maternal uterine or adnexal abnormality identified.

## 2018-04-19 ENCOUNTER — Encounter: Payer: Self-pay | Admitting: *Deleted

## 2018-04-19 DIAGNOSIS — Z3480 Encounter for supervision of other normal pregnancy, unspecified trimester: Secondary | ICD-10-CM | POA: Insufficient documentation

## 2018-04-21 ENCOUNTER — Ambulatory Visit (INDEPENDENT_AMBULATORY_CARE_PROVIDER_SITE_OTHER): Payer: BLUE CROSS/BLUE SHIELD

## 2018-04-21 DIAGNOSIS — Z113 Encounter for screening for infections with a predominantly sexual mode of transmission: Secondary | ICD-10-CM | POA: Diagnosis not present

## 2018-04-21 DIAGNOSIS — Z3481 Encounter for supervision of other normal pregnancy, first trimester: Secondary | ICD-10-CM | POA: Diagnosis not present

## 2018-04-21 DIAGNOSIS — Z3480 Encounter for supervision of other normal pregnancy, unspecified trimester: Secondary | ICD-10-CM

## 2018-04-21 NOTE — Progress Notes (Signed)
Subjective:   Judy ShiversJessica Alvarez is a 32 y.o. G4P3003 at 3735w6d by LMP confirmed by bedside u/s being seen today for her first obstetrical visit.  Her obstetrical history is significant for none and has H/O oophorectomy and Supervision of other normal pregnancy, antepartum on their problem list.. Patient does intend to breast feed. Pregnancy history fully reviewed.  Patient reports no complaints.  HISTORY: OB History  Gravida Para Term Preterm AB Living  4 3 3  0 0 3  SAB TAB Ectopic Multiple Live Births  0 0 0 0 3    # Outcome Date GA Lbr Len/2nd Weight Sex Delivery Anes PTL Lv  4 Current           3 Term 01/26/16 4245w3d 10:21 / 00:17 7 lb 1.9 oz (3.23 kg) M Vag-Spont EPI  LIV     Birth Comments: WNL     Name: Judy Alvarez     Apgar1: 8  Apgar5: 8  2 Term 01/06/14 6643w4d  8 lb 8 oz (3.856 kg) M Vag-Spont   LIV  1 Term 01/08/12 15105w6d  7 lb 7 oz (3.374 kg) F Vag-Spont   LIV     Name: Judy Alvarez   Past Medical History:  Diagnosis Date  . Anemia    Past Surgical History:  Procedure Laterality Date  . APPENDECTOMY    . 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 Diabetes Other   . Asthma Mother    Social History   Tobacco Use  . Smoking status: Never Smoker  . Smokeless tobacco: Never Used  Substance Use Topics  . Alcohol use: No  . Drug use: No   Allergies  Allergen Reactions  . Penicillins Anaphylaxis    Infant   Current Outpatient Medications on File Prior to Visit  Medication Sig Dispense Refill  . Prenatal Multivit-Min-Fe-FA (PRENATAL VITAMINS PO) Take 1 tablet by mouth daily. Reported on 10/10/2015     No current facility-administered medications on file prior to visit.      Exam   Vitals:   04/21/18 0949  BP: 110/71  Pulse: 79  Weight: 158 lb (71.7 kg)   Fetal Heart Rate (bpm): 166  Uterus:     Pelvic Exam: Perineum: no hemorrhoids, normal perineum   Vulva: normal external genitalia, no  lesions   Vagina:  normal mucosa, normal discharge   Cervix: no lesions and normal, pap smear done.    Adnexa: normal adnexa and no mass, fullness, tenderness   Bony Pelvis: average  System: General: well-developed, well-nourished female in no acute distress   Breast:  normal appearance, no masses or tenderness   Skin: normal coloration and turgor, no rashes   Neurologic: oriented, normal, negative, normal mood   Extremities: normal strength, tone, and muscle mass, ROM of all joints is normal   HEENT PERRLA, extraocular movement intact and sclera clear, anicteric   Mouth/Teeth mucous membranes moist, pharynx normal without lesions and dental hygiene good   Neck supple and no masses   Cardiovascular: regular rate and rhythm   Respiratory:  no respiratory distress, normal breath sounds   Abdomen: soft, non-tender; bowel sounds normal; no masses,  no organomegaly     Assessment:   Pregnancy: [id number] Patient Active Problem List   Diagnosis Date Noted  . Supervision of other normal pregnancy, antepartum 04/19/2018  . H/O oophorectomy 06/20/2015     Plan:  1. Supervision  of other normal pregnancy, antepartum - No complaints, routine care - Patient planning NIPS, will draw NOB labs at lab only visit  - Culture, OB Urine - Obstetric panel - HIV antibody (with reflex) - Urine cytology ancillary only([place]) - Babyscripts Schedule Optimization   Initial labs drawn. Continue prenatal vitamins. Genetic Screening discussed, NIPS: ordered. Ultrasound discussed; fetal anatomic survey: requested. Problem list reviewed and updated. The nature of [facility] with multiple MDs and other Advanced Practice Providers was explained to patient; also emphasized that residents, students are part of our team. Routine obstetric precautions reviewed. Return in about 8 weeks (around 06/16/2018) for Return OB visit.   Rolm Bookbinder, CNM 04/21/18 10:31  AM

## 2018-04-21 NOTE — Patient Instructions (Signed)

## 2018-04-21 NOTE — Progress Notes (Signed)
Bedside U/S shows single IUP with FHT of 166 BPM and CRL measures 42.185mm  GA 6269w1d.  Pt does request to do BRX opt sched

## 2018-04-24 LAB — URINE CYTOLOGY ANCILLARY ONLY
CHLAMYDIA, DNA PROBE: NEGATIVE
NEISSERIA GONORRHEA: NEGATIVE

## 2018-04-25 ENCOUNTER — Other Ambulatory Visit: Payer: Self-pay

## 2018-04-25 ENCOUNTER — Emergency Department
Admission: EM | Admit: 2018-04-25 | Discharge: 2018-04-25 | Disposition: A | Discharge status: Home / Self Care | Payer: BLUE CROSS/BLUE SHIELD | Source: Home / Self Care | Attending: Family Medicine | Admitting: Family Medicine

## 2018-04-25 ENCOUNTER — Encounter: Payer: Self-pay | Admitting: *Deleted

## 2018-04-25 DIAGNOSIS — B9789 Other viral agents as the cause of diseases classified elsewhere: Secondary | ICD-10-CM | POA: Diagnosis not present

## 2018-04-25 DIAGNOSIS — J069 Acute upper respiratory infection, unspecified: Secondary | ICD-10-CM | POA: Diagnosis not present

## 2018-04-25 NOTE — ED Triage Notes (Signed)
Pt c/o productive cough x 4 days. She is [redacted] wks pregnant.

## 2018-04-25 NOTE — Discharge Instructions (Signed)
°  Please follow up with family medicine in 1 week if not improving. °

## 2018-04-25 NOTE — ED Provider Notes (Signed)
[facility]    CSN: [id number] Arrival date & time: 04/25/18  1258     History   Chief Complaint Chief Complaint  Patient presents with  . Cough    HPI Judy Alvarez is a 32 y.o. female.   HPI Judy Alvarez is a 32 y.o. female presenting to UC with c/o mildly productive cough for 4 days. She has tried herbal supplements because she is [redacted] weeks pregnant.  Subjective fever 4 days ago and chills 3 days ago but none since. Denies chest pain or SOB but she does have mild SOB when she coughs.  Denies n/v/d. No hx of asthma.     Past Medical History:  Diagnosis Date  . Anemia     Patient Active Problem List   Diagnosis Date Noted  . Supervision of other normal pregnancy, antepartum 04/19/2018  . H/O oophorectomy 06/20/2015    Past Surgical History:  Procedure Laterality Date  . APPENDECTOMY    . 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 Authorizing Provider  Prenatal Multivit-Min-Fe-FA (PRENATAL VITAMINS PO) Take 1 tablet by mouth daily. Reported on 10/10/2015    [provider]    Family History Family History  Problem Relation Age of Onset  . Diabetes Other   . Asthma Mother     Social History Social History   Tobacco Use  . Smoking status: Never Smoker  . Smokeless tobacco: Never Used  Substance Use Topics  . Alcohol use: No  . Drug use: No     Allergies   Penicillins   Review of Systems Review of Systems  Constitutional: Negative for chills and fever.  HENT: Positive for congestion. Negative for ear pain, sore throat, trouble swallowing and voice change.   Respiratory: Positive for cough and shortness of breath (with coughin at times).  Negative for chest tightness.   Cardiovascular: Negative for chest pain and palpitations.  Gastrointestinal: Negative for abdominal pain, diarrhea, nausea and vomiting.  Musculoskeletal: Negative for arthralgias, back pain and myalgias.  Skin: Negative for rash.     Physical Exam Triage Vital Signs ED Triage Vitals  Enc Vitals Group     BP 04/25/18 1318 114/81     Pulse Rate 04/25/18 1318 86     Resp 04/25/18 1318 18     Temp 04/25/18 1318 (!) 97.5 F (36.4 C)     Temp Source 04/25/18 1318 Oral     SpO2 04/25/18 1318 99 %     Weight --      Height 04/25/18 1322 5\' 2"  (1.575 m)     Head Circumference --      Peak Flow --      Pain Score 04/25/18 1322 0     Pain Loc --      Pain Edu? --      Excl. in GC? --    No data found.  Updated Vital Signs BP  114/81 (BP Location: Right Arm)   Pulse 86   Temp (!) 97.5 F (36.4 C) (Oral)   Resp 18   Ht 5\' 2"  (1.575 m)   LMP 02/04/2018   SpO2 99%   BMI 28.90 kg/m   Visual Acuity Right Eye Distance:   Left Eye Distance:   Bilateral Distance:    Right Eye Near:   Left Eye Near:    Bilateral Near:     Physical Exam  Constitutional: She is oriented to person, place, and time. She appears well-developed and well-nourished. No distress.  HENT:  Head: Normocephalic and atraumatic.  Right Ear: Tympanic membrane normal.  Left Ear: Tympanic membrane normal.  Nose: Nose normal.  Mouth/Throat: Uvula is midline, oropharynx is clear and moist and mucous membranes are normal.  Eyes: EOM are normal.  Neck: Normal range of motion. Neck supple.  Cardiovascular: Normal rate.  Pulmonary/Chest: Effort normal and breath sounds normal. No stridor. No respiratory distress. She has no wheezes. She has no rales.  Musculoskeletal: Normal range of motion.  Lymphadenopathy:    She has no cervical adenopathy.  Neurological: She is alert and oriented to person, place, and time.  Skin: Skin is warm and dry. She is not diaphoretic.  Psychiatric:  She has a normal mood and affect. Her behavior is normal.  Nursing note and vitals reviewed.    UC Treatments / Results  Labs (all labs ordered are listed, but only abnormal results are displayed) Labs Reviewed - No data to display  EKG None  Radiology No results found.  Procedures Procedures (including critical care time)  Medications Ordered in UC Medications - No data to display  Initial Impression / Assessment and Plan / UC Course  I have reviewed the triage vital signs and the nursing notes.  Pertinent labs & imaging results that were available during my care of the patient were reviewed by me and considered in my medical decision making (see chart for details).     Lungs: CTAB O2 Sat 99% on RA Pt appears well, NAD Lungs: CTAB Hx and exam c/w viral illness  Final Clinical Impressions(s) / UC Diagnoses   Final diagnoses:  Viral URI with cough     Discharge Instructions      Please follow up with family medicine in 1 week if not improving.    ED Prescriptions    None     Controlled Substance Prescriptions [place] Controlled Substance Registry consulted? Not Applicable   Rolla Plate 04/25/18 1348

## 2018-04-26 LAB — URINE CULTURE, OB REFLEX

## 2018-04-26 LAB — CULTURE, OB URINE

## 2018-05-15 ENCOUNTER — Ambulatory Visit (INDEPENDENT_AMBULATORY_CARE_PROVIDER_SITE_OTHER): Payer: BLUE CROSS/BLUE SHIELD | Admitting: *Deleted

## 2018-05-15 DIAGNOSIS — Z3480 Encounter for supervision of other normal pregnancy, unspecified trimester: Secondary | ICD-10-CM

## 2018-05-15 NOTE — Progress Notes (Signed)
Pt here for PNL and Nipts

## 2018-05-16 LAB — OBSTETRIC PANEL
Absolute Monocytes: 403 cells/uL (ref 200–950)
Antibody Screen: NOT DETECTED
BASOS PCT: 0.1 %
Basophils Absolute: 8 cells/uL (ref 0–200)
Eosinophils Absolute: 182 cells/uL (ref 15–500)
Eosinophils Relative: 2.4 %
HEMATOCRIT: 35.6 % (ref 35.0–45.0)
HEMOGLOBIN: 12.4 g/dL (ref 11.7–15.5)
Hepatitis B Surface Ag: NONREACTIVE
Lymphs Abs: 2052 cells/uL (ref 850–3900)
MCH: 30.9 pg (ref 27.0–33.0)
MCHC: 34.8 g/dL (ref 32.0–36.0)
MCV: 88.8 fL (ref 80.0–100.0)
MONOS PCT: 5.3 %
MPV: 9.4 fL (ref 7.5–12.5)
Neutro Abs: 4955 cells/uL (ref 1500–7800)
Neutrophils Relative %: 65.2 %
Platelets: 287 10*3/uL (ref 140–400)
RBC: 4.01 10*6/uL (ref 3.80–5.10)
RDW: 13.4 % (ref 11.0–15.0)
RPR Ser Ql: NONREACTIVE
Rubella: 1.46 index
TOTAL LYMPHOCYTE: 27 %
WBC: 7.6 10*3/uL (ref 3.8–10.8)

## 2018-05-16 LAB — HIV ANTIBODY (ROUTINE TESTING W REFLEX): HIV 1&2 Ab, 4th Generation: NONREACTIVE

## 2018-05-22 ENCOUNTER — Telehealth: Payer: Self-pay

## 2018-05-22 DIAGNOSIS — Z3480 Encounter for supervision of other normal pregnancy, unspecified trimester: Secondary | ICD-10-CM

## 2018-05-22 NOTE — Telephone Encounter (Signed)
Spoke with pt and she is aware of negative NIPT and sex of baby is female

## 2018-05-25 ENCOUNTER — Encounter: Payer: Self-pay | Admitting: *Deleted

## 2018-05-25 DIAGNOSIS — Z3480 Encounter for supervision of other normal pregnancy, unspecified trimester: Secondary | ICD-10-CM

## 2018-05-31 NOTE — L&D Delivery Note (Signed)
LABOR COURSE Patient was admitted as a scheduled IOL for A1GDM. She was augmented with Pitocin. She SROM'd today at 1145 with moderate amount of green-tinged fluid. Her CBGs were well controlled throughout labor.  Delivery Note Called to room and patient was complete and pushing. Head delivered ROT. Loose nuchal cord x 1, delivered through without difficulty. Shoulder and body delivered in usual fashion. At 1510 a viable female was delivered via Vaginal, Spontaneous (Presentation:ROT ;ROA).  Infant with spontaneous cry, placed on mother's abdomen, dried and stimulated. Cord clamped x 2 after two-minute delay, and cut by FOB Edison Nasuti. Cord blood drawn. Placenta delivered spontaneously with gentle cord traction. Appears intact. Fundus firm with massage and Pitocin. Labia, perineum, vagina, and cervix inspected with hemostatic perineal abrasion noted.    APGAR:9,9; weight: 3605g  .   Cord: 3VC with the following complications: nuchal x 1.   Cord pH: not collected  Anesthesia:  Epidural Episiotomy: None Lacerations: hemostatic perineal abrasion, repair not indicated Est. Blood Loss (mL): 166  Mom to postpartum.  Baby to Couplet care / Skin to Skin.  Mallie Snooks, CNM 11/04/18  5:13 PM

## 2018-06-13 ENCOUNTER — Ambulatory Visit (INDEPENDENT_AMBULATORY_CARE_PROVIDER_SITE_OTHER): Payer: Medicaid Other | Admitting: Advanced Practice Midwife

## 2018-06-13 DIAGNOSIS — Z3480 Encounter for supervision of other normal pregnancy, unspecified trimester: Secondary | ICD-10-CM

## 2018-06-13 NOTE — Patient Instructions (Signed)

## 2018-06-13 NOTE — Progress Notes (Signed)
   PRENATAL VISIT NOTE  Subjective:  Judy Alvarez is a 33 y.o. G4P3003 at [redacted]w[redacted]d being seen today for ongoing prenatal care.  She is currently monitored for the following issues for this low-risk pregnancy and has H/O oophorectomy and Supervision of other normal pregnancy, antepartum on their problem list.  Patient reports no complaints.  Contractions: Regular. Vag. Bleeding: None.   . Denies leaking of fluid.   The following portions of the patient's history were reviewed and updated as appropriate: allergies, current medications, past family history, past medical history, past social history, past surgical history and problem list. Problem list updated.  Objective:   Vitals:   06/13/18 1313  BP: 101/61  Pulse: 85  Weight: 74.4 kg    Fetal Status: Fetal Heart Rate (bpm): 150         General:  Alert, oriented and cooperative. Patient is in no acute distress.  Skin: Skin is warm and dry. No rash noted.   Cardiovascular: Normal heart rate noted  Respiratory: Normal respiratory effort, no problems with respiration noted  Abdomen: Soft, gravid, appropriate for gestational age.  Pain/Pressure: Absent     Pelvic: Cervical exam deferred        Extremities: Normal range of motion.  Edema: Trace  Mental Status: Normal mood and affect. Normal behavior. Normal judgment and thought content.   Assessment and Plan:  Pregnancy: G4P3003 at [redacted]w[redacted]d  1. Supervision of other normal pregnancy, antepartum --Anticipatory guidance about next visits/weeks of pregnancy given. --Anatomy US ordered --Pt is doing babyscripts schedule, is here at [redacted]w[redacted]d.  Add a visit @ 23-24 weeks, then resume at 28 weeks so not 10 weeks between visits.     Preterm labor symptoms and general obstetric precautions including but not limited to vaginal bleeding, contractions, leaking of fluid and fetal movement were reviewed in detail with the patient. Please refer to After Visit Summary for other counseling recommendations.    No follow-ups on file.  No future appointments.  Sharen Counter, CNM

## 2018-06-23 ENCOUNTER — Encounter (HOSPITAL_COMMUNITY): Payer: Self-pay

## 2018-06-30 ENCOUNTER — Ambulatory Visit (HOSPITAL_COMMUNITY)
Admission: RE | Admit: 2018-06-30 | Discharge: 2018-06-30 | Disposition: A | Discharge status: Home / Self Care | Payer: BLUE CROSS/BLUE SHIELD | Source: Ambulatory Visit | Attending: Advanced Practice Midwife | Admitting: Advanced Practice Midwife

## 2018-06-30 DIAGNOSIS — Z3480 Encounter for supervision of other normal pregnancy, unspecified trimester: Secondary | ICD-10-CM | POA: Insufficient documentation

## 2018-06-30 DIAGNOSIS — Z3482 Encounter for supervision of other normal pregnancy, second trimester: Secondary | ICD-10-CM

## 2018-06-30 DIAGNOSIS — Z363 Encounter for antenatal screening for malformations: Secondary | ICD-10-CM | POA: Diagnosis not present

## 2018-07-11 ENCOUNTER — Encounter: Payer: Medicaid Other | Admitting: Advanced Practice Midwife

## 2018-07-14 ENCOUNTER — Ambulatory Visit (INDEPENDENT_AMBULATORY_CARE_PROVIDER_SITE_OTHER): Payer: Medicaid Other

## 2018-07-14 VITALS — BP 109/62 | HR 81 | Wt 171.0 lb

## 2018-07-14 DIAGNOSIS — Z3482 Encounter for supervision of other normal pregnancy, second trimester: Secondary | ICD-10-CM

## 2018-07-14 DIAGNOSIS — Z3A22 22 weeks gestation of pregnancy: Secondary | ICD-10-CM

## 2018-07-14 DIAGNOSIS — Z3480 Encounter for supervision of other normal pregnancy, unspecified trimester: Secondary | ICD-10-CM

## 2018-07-14 NOTE — Progress Notes (Signed)
   PRENATAL VISIT NOTE  Subjective:  Judy Alvarez is a 33 y.o. G4P3003 at [redacted]w[redacted]d being seen today for ongoing prenatal care.  She is currently monitored for the following issues for this low-risk pregnancy and has H/O oophorectomy and Supervision of other normal pregnancy, antepartum on their problem list.  Patient reports no complaints.  Contractions: Not present. Vag. Bleeding: None.  Movement: Present. Denies leaking of fluid.   The following portions of the patient's history were reviewed and updated as appropriate: allergies, current medications, past family history, past medical history, past social history, past surgical history and problem list. Problem list updated.  Objective:   Vitals:   07/14/18 1003  BP: 109/62  Pulse: 81  Weight: 171 lb (77.6 kg)    Fetal Status: Fetal Heart Rate (bpm): 147 Fundal Height: 23 cm Movement: Present     General:  Alert, oriented and cooperative. Patient is in no acute distress.  Skin: Skin is warm and dry. No rash noted.   Cardiovascular: Normal heart rate noted  Respiratory: Normal respiratory effort, no problems with respiration noted  Abdomen: Soft, gravid, appropriate for gestational age.  Pain/Pressure: Present     Pelvic: Cervical exam deferred        Extremities: Normal range of motion.  Edema: Trace  Mental Status: Normal mood and affect. Normal behavior. Normal judgment and thought content.   Assessment and Plan:  Pregnancy: G4P3003 at [redacted]w[redacted]d  1. Supervision of other normal pregnancy, antepartum - No complaints. Routine care - Anticipatory guidance of GTT at next visit.   Preterm labor symptoms and general obstetric precautions including but not limited to vaginal bleeding, contractions, leaking of fluid and fetal movement were reviewed in detail with the patient. Please refer to After Visit Summary for other counseling recommendations.  Return in about 5 weeks (around 08/18/2018) for Return OB visit, 2hr GTT and  labs.  Future Appointments  Date Time Provider Department Center  08/18/2018  8:15 AM Donette Larry, CNM [facility]    Rolm Bookbinder, [place] 07/14/18 11:12 AM

## 2018-07-14 NOTE — Patient Instructions (Signed)

## 2018-08-18 ENCOUNTER — Telehealth: Payer: Self-pay | Admitting: *Deleted

## 2018-08-18 ENCOUNTER — Encounter: Payer: Medicaid Other | Admitting: Certified Nurse Midwife

## 2018-08-18 NOTE — Telephone Encounter (Signed)
Called patient about missed OB appointment on 08/18/2018 at 8:15am. Phone rung once or twice and then a busy signal. Not able to leave a message.

## 2018-08-25 ENCOUNTER — Ambulatory Visit (INDEPENDENT_AMBULATORY_CARE_PROVIDER_SITE_OTHER): Payer: Medicaid Other

## 2018-08-25 ENCOUNTER — Other Ambulatory Visit: Payer: Self-pay

## 2018-08-25 VITALS — BP 97/59 | HR 88 | Wt 176.0 lb

## 2018-08-25 DIAGNOSIS — Z3480 Encounter for supervision of other normal pregnancy, unspecified trimester: Secondary | ICD-10-CM

## 2018-08-25 DIAGNOSIS — Z3483 Encounter for supervision of other normal pregnancy, third trimester: Secondary | ICD-10-CM

## 2018-08-25 DIAGNOSIS — Z3A28 28 weeks gestation of pregnancy: Secondary | ICD-10-CM

## 2018-08-25 NOTE — Progress Notes (Signed)
-   Declines Tdap

## 2018-08-25 NOTE — Progress Notes (Signed)
   PRENATAL VISIT NOTE  Subjective:  Judy Alvarez is a 33 y.o. G4P3003 at [redacted]w[redacted]d being seen today for ongoing prenatal care.  She is currently monitored for the following issues for this low-risk pregnancy and has H/O oophorectomy and Supervision of other normal pregnancy, antepartum on their problem list.  Patient reports no complaints.  Contractions: Not present. Vag. Bleeding: None.  Movement: Present. Denies leaking of fluid.   The following portions of the patient's history were reviewed and updated as appropriate: allergies, current medications, past family history, past medical history, past social history, past surgical history and problem list.   Objective:   Vitals:   08/25/18 0844  BP: (!) 97/59  Pulse: 88  Weight: 176 lb (79.8 kg)    Fetal Status: Fetal Heart Rate (bpm): 147 Fundal Height: 29 cm Movement: Present     General:  Alert, oriented and cooperative. Patient is in no acute distress.  Skin: Skin is warm and dry. No rash noted.   Cardiovascular: Normal heart rate noted  Respiratory: Normal respiratory effort, no problems with respiration noted  Abdomen: Soft, gravid, appropriate for gestational age.  Pain/Pressure: Absent     Pelvic: Cervical exam deferred        Extremities: Normal range of motion.  Edema: None  Mental Status: Normal mood and affect. Normal behavior. Normal judgment and thought content.   Assessment and Plan:  Pregnancy: G4P3003 at [redacted]w[redacted]d 1. Supervision of other normal pregnancy, antepartum - No complaints. Routine care - Patient enrolled in [place] and will do next visit Telehealth  - CBC - HIV antibody (with reflex) - RPR - 2Hr GTT w/ 1 Hr Carpenter 75 g  Preterm labor symptoms and general obstetric precautions including but not limited to vaginal bleeding, contractions, leaking of fluid and fetal movement were reviewed in detail with the patient. Please refer to After Visit Summary for other counseling recommendations.   Return in  about 4 weeks (around 09/22/2018) for Return OB visit.   Rolm Bookbinder, CNM 08/25/18 9:10 AM

## 2018-08-25 NOTE — Patient Instructions (Signed)

## 2018-08-28 ENCOUNTER — Other Ambulatory Visit: Payer: Self-pay

## 2018-08-28 DIAGNOSIS — O24419 Gestational diabetes mellitus in pregnancy, unspecified control: Secondary | ICD-10-CM

## 2018-08-28 LAB — 2HR GTT W 1 HR, CARPENTER, 75 G
GLUCOSE, FASTING, GEST: 87 mg/dL (ref 65–91)
Glucose, 1 Hr, Gest: 136 mg/dL (ref 65–179)
Glucose, 2 Hr, Gest: 189 mg/dL — ABNORMAL HIGH (ref 65–152)

## 2018-08-28 LAB — CBC
HEMATOCRIT: 33.6 % — AB (ref 35.0–45.0)
Hemoglobin: 11.4 g/dL — ABNORMAL LOW (ref 11.7–15.5)
MCH: 30.2 pg (ref 27.0–33.0)
MCHC: 33.9 g/dL (ref 32.0–36.0)
MCV: 89.1 fL (ref 80.0–100.0)
MPV: 9.1 fL (ref 7.5–12.5)
Platelets: 239 10*3/uL (ref 140–400)
RBC: 3.77 10*6/uL — AB (ref 3.80–5.10)
RDW: 12.7 % (ref 11.0–15.0)
WBC: 8.6 10*3/uL (ref 3.8–10.8)

## 2018-08-28 LAB — RPR: RPR: NONREACTIVE

## 2018-08-28 LAB — TIQ-MISC

## 2018-08-28 LAB — HIV ANTIBODY (ROUTINE TESTING W REFLEX): HIV: NONREACTIVE

## 2018-09-05 ENCOUNTER — Encounter: Payer: Self-pay | Admitting: *Deleted

## 2018-09-12 ENCOUNTER — Other Ambulatory Visit: Payer: Self-pay

## 2018-09-12 ENCOUNTER — Other Ambulatory Visit: Payer: Self-pay | Admitting: *Deleted

## 2018-09-12 ENCOUNTER — Ambulatory Visit: Payer: Medicaid Other | Admitting: Registered"

## 2018-09-12 MED ORDER — ACCU-CHEK FASTCLIX LANCETS MISC: 1.0000 | Freq: Four times a day (QID) | 12 refills | Status: DC

## 2018-09-12 MED ORDER — GLUCOSE BLOOD VI STRP: ORAL_STRIP | 12 refills | Status: DC

## 2018-09-12 MED ORDER — BLOOD GLUCOSE MONITOR KIT: PACK | 0 refills | Status: DC

## 2018-09-12 NOTE — Progress Notes (Deleted)
RD called patient to complete the gestational diabetes education via phone call. Pt was at the grocery store, rescheduled for tomorrow and she will be home at the time agreed on (1:30)

## 2018-09-13 ENCOUNTER — Encounter: Payer: Self-pay | Admitting: Registered"

## 2018-09-13 ENCOUNTER — Encounter: Payer: BLUE CROSS/BLUE SHIELD | Admitting: Registered"

## 2018-09-13 ENCOUNTER — Other Ambulatory Visit: Payer: Self-pay

## 2018-09-13 DIAGNOSIS — O99810 Abnormal glucose complicating pregnancy: Secondary | ICD-10-CM | POA: Insufficient documentation

## 2018-09-13 NOTE — Progress Notes (Signed)
Gestational Diabetes Self-Management Education  I connected with Judy ShiversJessica Alvarez on 09/13/18 at  1:30 PM EDT by telephone at home and verified that I am speaking with the correct person using two identifiers.  Visit Type: First/Initial  Appt. Start Time: 1330 Appt. End Time: 1500  09/13/2018  Ms. Judy Alvarez, identified by name and date of birth, is a 33 y.o. female with a diagnosis of Diabetes: Gestational Diabetes.   ASSESSMENT  Last menstrual period 02/04/2018, currently breastfeeding. There is no height or weight on file to calculate BMI.   Pt reports some low blood sugar symptoms Pt states her mother has issues with hypoglycemia. Pt states she follows a modified keto diet, was trying to lose baby fat from last pregnancy prior to this pregnancy.  Diabetes Self-Management Education - 09/13/18 1341      Visit Information   Visit Type  First/Initial      Initial Visit   Diabetes Type  Gestational Diabetes    Are you currently following a meal plan?  Yes    What type of meal plan do you follow?  modified keto      Health Coping   How would you rate your overall health?  Excellent      Psychosocial Assessment   Patient Belief/Attitude about Diabetes  Other (comment)   discouraged   How often do you need to have someone help you when you read instructions, pamphlets, or other written materials from your doctor or pharmacy?  2 - Rarely    What is the last grade level you completed in school?  4-yr degree      Dietary Intake   Breakfast  boiled egg, bacon OR today had bacon, tomato sandwich    Snack (morning)  none OR     Lunch  cheeseburger, in lettuce    Snack (afternoon)  collards    Dinner  chicken or fish, vegetable, fruit    Snack (evening)  none OR orange    Beverage(s)  water, coffee, herbal tea      Exercise   Exercise Type  Light (walking / raking leaves)    How many days per week to you exercise?  7    How many minutes per day do you exercise?  20    Total  minutes per week of exercise  140      Patient Education   Previous Diabetes Education  No    Psychosocial adjustment  Role of stress on diabetes    Preconception care  Pregnancy and GDM  Role of pre-pregnancy blood glucose control on the development of the fetus;Reviewed with patient blood glucose goals with pregnancy      Outcomes   Expected Outcomes  Demonstrated interest in learning. Expect positive outcomes    Future DMSE  PRN    Program Status  Completed       Individualized Plan for Diabetes Self-Management Training:   Learning Objective:  Patient will have a greater understanding of diabetes self-management. Patient education plan is to attend individual and/or group sessions per assessed needs and concerns.    definition of Gestational Diabetes  why dietary management is important in controlling blood glucose  effects each nutrient has on blood glucose levels  Components of a balanced meal plan  carbohydrate counting   when to check blood glucose levels  Viewed video of proper blood glucose monitoring techniques  effect of stress and exercise on blood glucose levels  importance of limiting caffeine and abstaining from alcohol and  smoking  Blood glucose monitor given: none, pt will pick up from pharmacy today  Patient instructed to monitor glucose levels: FBS: 60 - <95; 1 hour: <140; 2 hour: <120  Patient received handouts:  Nutrition Diabetes and Pregnancy, including carb counting list  Expected Outcomes:  Demonstrated interest in learning. Expect positive outcomes   If problems or questions, patient to contact team via:  Email  Future DSME appointment: PRN

## 2018-09-14 ENCOUNTER — Other Ambulatory Visit: Payer: Self-pay

## 2018-09-20 ENCOUNTER — Other Ambulatory Visit: Payer: Self-pay

## 2018-09-20 ENCOUNTER — Ambulatory Visit (INDEPENDENT_AMBULATORY_CARE_PROVIDER_SITE_OTHER): Payer: Medicaid Other | Admitting: Obstetrics and Gynecology

## 2018-09-20 VITALS — BP 92/71 | Wt 176.0 lb

## 2018-09-20 DIAGNOSIS — Z3480 Encounter for supervision of other normal pregnancy, unspecified trimester: Secondary | ICD-10-CM

## 2018-09-20 DIAGNOSIS — O99810 Abnormal glucose complicating pregnancy: Secondary | ICD-10-CM

## 2018-09-20 DIAGNOSIS — Z3A32 32 weeks gestation of pregnancy: Secondary | ICD-10-CM

## 2018-09-20 NOTE — Progress Notes (Signed)
TELEHEALTH VIRTUAL OBSTETRICS VISIT ENCOUNTER NOTE  I connected with Judy Alvarez on 09/20/18 at 10:15 AM EDT by Webex at home and verified that I am speaking with the correct person using two identifiers.   I discussed the limitations, risks, security and privacy concerns of performing an evaluation and management service by telephone and the availability of in person appointments. I also discussed with the patient that there may be a patient responsible charge related to this service. The patient expressed understanding and agreed to proceed.  Subjective:  Judy Alvarez is a 33 y.o. G4P3003 at 3w4dbeing followed for ongoing prenatal care.  She is currently monitored for the following issues for this high-risk pregnancy and has H/O oophorectomy; Supervision of other normal pregnancy, antepartum; and Abnormal glucose tolerance test (GTT) during pregnancy, antepartum on their problem list.  Patient reports no complaints. Reports fetal movement. Denies any contractions, bleeding or leaking of fluid.   The following portions of the patient's history were reviewed and updated as appropriate: allergies, current medications, past family history, past medical history, past social history, past surgical history and problem list.   Objective:   General:  Alert, oriented and cooperative.   Mental Status: Normal mood and affect perceived. Normal judgment and thought content.  Rest of physical exam deferred due to type of encounter  Assessment and Plan:  Pregnancy: G4P3003 at 374w4d1. Abnormal glucose tolerance test (GTT) during pregnancy, antepartum  She should start antenatal testing. Has not been checking her BS She picked up her meter and supplies and met with DM education . Was told by her nutritionist that it sounds like she has "low blood sugar."   2 hour GTT Glucose, Fasting, Gest 65 - 91 mg/dL 87   Glucose, 1 Hr, Gest 65 - 179 mg/dL 136   Glucose, 2 Hr, Gest 65 - 152 mg/dL  189    High     - [place] OB FOLLOW UP; Future for growth. - [place] BPP W/NONSTRESS; Future  2. Supervision of other normal pregnancy, antepartum  - [place] OB FOLLOW UP; Future - [place] BPP W/NONSTRESS; Future - RN walked patient through baby scripts app and how to enter BS readings for [place] review.  - Lengthy discussion about importance of testing blood sugars,  logging BS and keeping appointments. Discussed risks of uncontrolled DM in pregnancy including delayed fetal lung maturity, IUFD and shoulder dystocia possibly resulting brachial plexus palsy, brain damage, intrapartum death and extensive obstetric lacerations. Patient verbalizes understanding.    There are no diagnoses linked to this encounter. Preterm labor symptoms and general obstetric precautions including but not limited to vaginal bleeding, contractions, leaking of fluid and fetal movement were reviewed in detail with the patient.  I discussed the assessment and treatment plan with the patient. The patient was provided an opportunity to ask questions and all were answered. The patient agreed with the plan and demonstrated an understanding of the instructions. The patient was advised to call back or seek an in-person office evaluation/[facility] any urgent or concerning symptoms. Please refer to After Visit Summary for other counseling recommendations.   I provided 20 minutes of non-face-to-face time during this encounter.  Return in about 1 week (around 09/27/2018) for She should start antenatal testing: High risk MD only. .  Future Appointments  Date Time Provider [place]4/27/2020 10:00 AM DoEmily FilbertMD [facility]4/30/[facility]  1:30 PM  [facility] NURSE [facility]  10/02/2018  3:45 PM [place] 2 WH-MFCUS MFC-US    Noni Saupe, NP Center for [employer], [place]

## 2018-09-22 ENCOUNTER — Encounter: Payer: Medicaid Other | Admitting: Certified Nurse Midwife

## 2018-09-25 ENCOUNTER — Ambulatory Visit (INDEPENDENT_AMBULATORY_CARE_PROVIDER_SITE_OTHER): Payer: Medicaid Other | Admitting: Obstetrics & Gynecology

## 2018-09-25 ENCOUNTER — Other Ambulatory Visit: Payer: Self-pay

## 2018-09-25 VITALS — Wt 177.0 lb

## 2018-09-25 DIAGNOSIS — O99810 Abnormal glucose complicating pregnancy: Secondary | ICD-10-CM

## 2018-09-25 DIAGNOSIS — Z3A33 33 weeks gestation of pregnancy: Secondary | ICD-10-CM

## 2018-09-25 DIAGNOSIS — Z3480 Encounter for supervision of other normal pregnancy, unspecified trimester: Secondary | ICD-10-CM

## 2018-09-25 DIAGNOSIS — Z3483 Encounter for supervision of other normal pregnancy, third trimester: Secondary | ICD-10-CM

## 2018-09-25 MED ORDER — METFORMIN HCL 500 MG PO TABS: ORAL_TABLET | ORAL | 6 refills | Status: DC

## 2018-09-25 NOTE — Progress Notes (Signed)
   PRENATAL VISIT NOTE TELEHEALTH VIRTUAL OBSTETRICS VISIT ENCOUNTER NOTE  I connected with@ on 09/25/18 at 10:00 AM EDT by Webex at home and verified that I am speaking with the correct person using two identifiers.   I discussed the limitations, risks, security and privacy concerns of performing an evaluation and management service by telephone and the availability of in person appointments. I also discussed with the patient that there may be a patient responsible charge related to this service. The patient expressed understanding and agreed to proceed. Subjective:  Judy Alvarez is a 33 y.o. [id number] (6, 4, 2 yo kids) at [redacted]w[redacted]d being seen today for ongoing prenatal care.  She is currently monitored for the following issues for this low-risk pregnancy and has H/O oophorectomy; Supervision of other normal pregnancy, antepartum; and Abnormal glucose tolerance test (GTT) during pregnancy, antepartum on their problem list.  Patient reports no complaints.  Reports fetal movement. Contractions: Not present. Vag. Bleeding: None.  Movement: Present. Denies any contractions, bleeding or leaking of fluid.   The following portions of the patient's history were reviewed and updated as appropriate: allergies, current medications, past family history, past medical history, past social history, past surgical history and problem list.   Objective:   Vitals:   09/25/18 0950  Weight: 177 lb (80.3 kg)    Fetal Status:     Movement: Present     General:  Alert, oriented and cooperative. Patient is in no acute distress.  Respiratory: Normal respiratory effort, no problems with respiration noted  Mental Status: Normal mood and affect. Normal behavior. Normal judgment and thought content.  Rest of physical exam deferred due to type of encounter  Assessment and Plan:  Pregnancy: G4P3003 at [redacted]w[redacted]d 1. Supervision of other normal pregnancy, antepartum  2. Abnormal glucose tolerance test (GTT) during pregnancy,  antepartum -she is putting them into Babyscripts and reports that the highest value after eating was 123 and that was about 30 minutes after eating an orange. Her fastings are between 90-100.  - rec baby asa daily - She is opposed to taking a medication at night to improve her fastings. She wants to take a few days to try to get it lower than 90. I will go ahead and call in metformin 500 mg qhs.  - she has a BPP this Thursday  Preterm labor symptoms and general obstetric precautions including but not limited to vaginal bleeding, contractions, leaking of fluid and fetal movement were reviewed in detail with the patient. I discussed the assessment and treatment plan with the patient. The patient was provided an opportunity to ask questions and all were answered. The patient agreed with the plan and demonstrated an understanding of the instructions. The patient was advised to call back or seek an in-person office evaluation/go to [facility] at [facility] for any urgent or concerning symptoms. Please refer to After Visit Summary for other counseling recommendations.  I provided 10 minutes of non-face-to-face time during this encounter. No follow-ups on file.  Future Appointments  Date Time Provider Department Center  09/25/2018 10:00 AM Allie Bossier, MD [facility]  09/28/2018  1:30 PM [facility] NURSE [facility]  10/02/2018  3:45 PM [facility]-MFC [place] 2 WH-MFCUS MFC-US    Allie Bossier, MD Center for [employer], [facility]

## 2018-09-28 ENCOUNTER — Ambulatory Visit (INDEPENDENT_AMBULATORY_CARE_PROVIDER_SITE_OTHER): Payer: BLUE CROSS/BLUE SHIELD

## 2018-09-28 ENCOUNTER — Other Ambulatory Visit: Payer: Self-pay

## 2018-09-28 VITALS — BP 111/70 | Wt 179.0 lb

## 2018-09-28 DIAGNOSIS — Z3A22 22 weeks gestation of pregnancy: Secondary | ICD-10-CM

## 2018-09-28 DIAGNOSIS — O24419 Gestational diabetes mellitus in pregnancy, unspecified control: Secondary | ICD-10-CM | POA: Diagnosis not present

## 2018-09-28 NOTE — Progress Notes (Signed)
Pt here for NST. NST reactive per Mariel Aloe, RN. Pt had questions on how to lower fasting blood sugar. Pt told to eat a light snack before bed per Dr.Leggett. Pt encouraged to contact Nutrition and Diabetes. Pt expressed understanding.

## 2018-10-02 ENCOUNTER — Ambulatory Visit (HOSPITAL_COMMUNITY)
Admission: RE | Admit: 2018-10-02 | Discharge: 2018-10-02 | Disposition: A | Discharge status: Home / Self Care | Payer: BLUE CROSS/BLUE SHIELD | Source: Ambulatory Visit | Attending: Obstetrics and Gynecology | Admitting: Obstetrics and Gynecology

## 2018-10-02 ENCOUNTER — Other Ambulatory Visit: Payer: Self-pay

## 2018-10-02 DIAGNOSIS — Z3480 Encounter for supervision of other normal pregnancy, unspecified trimester: Secondary | ICD-10-CM

## 2018-10-02 DIAGNOSIS — O2693 Pregnancy related conditions, unspecified, third trimester: Secondary | ICD-10-CM | POA: Diagnosis not present

## 2018-10-02 DIAGNOSIS — O99810 Abnormal glucose complicating pregnancy: Secondary | ICD-10-CM | POA: Insufficient documentation

## 2018-10-02 DIAGNOSIS — Z362 Encounter for other antenatal screening follow-up: Secondary | ICD-10-CM

## 2018-10-02 DIAGNOSIS — Z3A34 34 weeks gestation of pregnancy: Secondary | ICD-10-CM | POA: Diagnosis not present

## 2018-10-03 ENCOUNTER — Other Ambulatory Visit (HOSPITAL_COMMUNITY): Payer: Self-pay | Admitting: *Deleted

## 2018-10-03 DIAGNOSIS — O24410 Gestational diabetes mellitus in pregnancy, diet controlled: Secondary | ICD-10-CM

## 2018-10-09 ENCOUNTER — Ambulatory Visit (HOSPITAL_COMMUNITY): Payer: Medicaid Other

## 2018-10-09 ENCOUNTER — Encounter (HOSPITAL_COMMUNITY): Payer: Self-pay

## 2018-10-09 ENCOUNTER — Other Ambulatory Visit: Payer: Medicaid Other

## 2018-10-12 ENCOUNTER — Other Ambulatory Visit: Payer: Medicaid Other

## 2018-10-13 ENCOUNTER — Telehealth: Payer: Self-pay | Admitting: *Deleted

## 2018-10-13 NOTE — Telephone Encounter (Signed)
Patient mailbox is not set up to leave a message about office policies and to answer screening questions. MyChart message sent as well.

## 2018-10-16 ENCOUNTER — Encounter: Payer: Medicaid Other | Admitting: Obstetrics & Gynecology

## 2018-10-19 ENCOUNTER — Ambulatory Visit (INDEPENDENT_AMBULATORY_CARE_PROVIDER_SITE_OTHER): Payer: BLUE CROSS/BLUE SHIELD | Admitting: Obstetrics & Gynecology

## 2018-10-19 ENCOUNTER — Other Ambulatory Visit: Payer: Self-pay

## 2018-10-19 ENCOUNTER — Other Ambulatory Visit: Payer: Medicaid Other

## 2018-10-19 ENCOUNTER — Other Ambulatory Visit (HOSPITAL_COMMUNITY)
Admission: RE | Admit: 2018-10-19 | Discharge: 2018-10-19 | Disposition: A | Discharge status: Home / Self Care | Payer: BLUE CROSS/BLUE SHIELD | Source: Ambulatory Visit | Attending: Obstetrics & Gynecology | Admitting: Obstetrics & Gynecology

## 2018-10-19 VITALS — BP 103/62 | HR 85 | Wt 185.0 lb

## 2018-10-19 DIAGNOSIS — Z3A36 36 weeks gestation of pregnancy: Secondary | ICD-10-CM | POA: Diagnosis not present

## 2018-10-19 DIAGNOSIS — Z3480 Encounter for supervision of other normal pregnancy, unspecified trimester: Secondary | ICD-10-CM | POA: Insufficient documentation

## 2018-10-19 DIAGNOSIS — O24419 Gestational diabetes mellitus in pregnancy, unspecified control: Secondary | ICD-10-CM

## 2018-10-19 DIAGNOSIS — Z3483 Encounter for supervision of other normal pregnancy, third trimester: Secondary | ICD-10-CM

## 2018-10-19 LAB — OB RESULTS CONSOLE GBS: GBS: NEGATIVE

## 2018-10-19 NOTE — Progress Notes (Signed)
   PRENATAL VISIT NOTE  Subjective:  Judy Alvarez is a 33 y.o. G4P3003 at [redacted]w[redacted]d being seen today for ongoing prenatal care.  She is currently monitored for the following issues for this high-risk pregnancy and has H/O oophorectomy; Supervision of other normal pregnancy, antepartum; and Abnormal glucose tolerance test (GTT) during pregnancy, antepartum on their problem list.  Patient reports no complaints.  Contractions: Irritability. Vag. Bleeding: None.  Movement: Present. Denies leaking of fluid.   The following portions of the patient's history were reviewed and updated as appropriate: allergies, current medications, past family history, past medical history, past social history, past surgical history and problem list.   Objective:   Vitals:   10/19/18 1312  BP: 103/62  Pulse: 85  Weight: 185 lb (83.9 kg)    Fetal Status:     Movement: Present     General:  Alert, oriented and cooperative. Patient is in no acute distress.  Skin: Skin is warm and dry. No rash noted.   Cardiovascular: Normal heart rate noted  Respiratory: Normal respiratory effort, no problems with respiration noted  Abdomen: Soft, gravid, appropriate for gestational age.  Pain/Pressure: Absent     Pelvic: Cervical exam performed        Extremities: Normal range of motion.  Edema: None  Mental Status: Normal mood and affect. Normal behavior. Normal judgment and thought content.   Assessment and Plan:  Pregnancy: G4P3003 at [redacted]w[redacted]d 1. Supervision of other normal pregnancy, antepartum  - Culture, Grp B Strep w/Rflx Suscept - Cervicovaginal ancillary only( [place])  2. GDM- she moved into her in law's house and left her meter at her house so she wasn't able to check sugars for about a week. She does have her meter now, but not with her. She reports that her fastings are between 89- 96. 2 hour PCs are all less than 105. She declines metformin qhs She is aware that MFM rec'd BPP but she is limited with  transportation and cannot do this.   Preterm labor symptoms and general obstetric precautions including but not limited to vaginal bleeding, contractions, leaking of fluid and fetal movement were reviewed in detail with the patient. Please refer to After Visit Summary for other counseling recommendations.   No follow-ups on file.  No future appointments.  Allie Bossier, MD

## 2018-10-20 LAB — CERVICOVAGINAL ANCILLARY ONLY
Chlamydia: NEGATIVE
Neisseria Gonorrhea: NEGATIVE

## 2018-10-22 LAB — CULTURE, STREPTOCOCCUS GRP B W/SUSCEPT
MICRO NUMBER:: 497465
SPECIMEN QUALITY:: ADEQUATE

## 2018-10-24 ENCOUNTER — Encounter: Payer: Self-pay | Admitting: Obstetrics & Gynecology

## 2018-10-24 DIAGNOSIS — O24419 Gestational diabetes mellitus in pregnancy, unspecified control: Secondary | ICD-10-CM | POA: Insufficient documentation

## 2018-10-26 ENCOUNTER — Other Ambulatory Visit: Payer: Self-pay

## 2018-10-26 ENCOUNTER — Ambulatory Visit (INDEPENDENT_AMBULATORY_CARE_PROVIDER_SITE_OTHER): Payer: BLUE CROSS/BLUE SHIELD | Admitting: Obstetrics & Gynecology

## 2018-10-26 DIAGNOSIS — Z3A37 37 weeks gestation of pregnancy: Secondary | ICD-10-CM

## 2018-10-26 DIAGNOSIS — O24419 Gestational diabetes mellitus in pregnancy, unspecified control: Secondary | ICD-10-CM

## 2018-10-26 DIAGNOSIS — O99810 Abnormal glucose complicating pregnancy: Secondary | ICD-10-CM

## 2018-10-26 NOTE — Progress Notes (Signed)
TELEHEALTH OBSTETRICS PRENATAL VIRTUAL VIDEO VISIT ENCOUNTER NOTE  Provider location: [facility] [place]   I connected with Judy Alvarez on 10/26/18 at  2:15 PM EDT by WebEx Video Encounter at home and verified that I am speaking with the correct person using two identifiers.   I discussed the limitations, risks, security and privacy concerns of performing an evaluation and management service by telephone and the availability of in person appointments. I also discussed with the patient that there may be a patient responsible charge related to this service. The patient expressed understanding and agreed to proceed. Subjective:  Judy Alvarez is a 33 y.o. G4P3003 at [redacted]w[redacted]d being seen today for ongoing prenatal care.  She is currently monitored for the following issues for this high-risk pregnancy and has H/O oophorectomy; Supervision of other normal pregnancy, antepartum; Abnormal glucose tolerance test (GTT) during pregnancy, antepartum; and GDM (gestational diabetes mellitus) on their problem list.  Patient reports no complaints.  Contractions: Irritability. Vag. Bleeding: None.  Movement: Present. Denies any leaking of fluid.   The following portions of the patient's history were reviewed and updated as appropriate: allergies, current medications, past family history, past medical history, past social history, past surgical history and problem list.   Objective:  There were no vitals filed for this visit.  Fetal Status:     Movement: Present     General:  Alert, oriented and cooperative. Patient is in no acute distress.  Respiratory: Normal respiratory effort, no problems with respiration noted  Mental Status: Normal mood and affect. Normal behavior. Normal judgment and thought content.  Rest of physical exam deferred due to type of encounter  Imaging: [place] Mfm Ob Follow Up  Result Date: 10/03/2018  ----------------------------------------------------------------------  OBSTETRICS REPORT                       (Signed Final 10/03/2018 10:08 am) ---------------------------------------------------------------------- Patient Info  ID #:       [id number]                          D.O.B.:  Dec 09, 1985 (33 yrs)  Name:       Judy Alvarez                 Visit Date: 10/02/2018 04:12 pm ---------------------------------------------------------------------- Performed By  Performed By:     Percell Boston          Ref. Address:     [address]                                                             [place], [place]  Attending:        Lin Landsman      [facility]                    MD  Fetal [facility]  Referred By:      Everardo All ---------------------------------------------------------------------- Orders   #  Description                          Code         Ordered By   1  [place] MFM OB FOLLOW UP                  [id number]     JENNIFER RASCH   2  US FETAL BPP W/NONSTRESS             45409.8      JENNIFER [facility]  ----------------------------------------------------------------------   #  Order #                    Accession #                 Episode #   1  [id number]                  [id number]                  [id number]   2  [id number]                  [id number]                  [id number]  ---------------------------------------------------------------------- Indications   [redacted] weeks gestation of pregnancy                Z3A.34   Medical complication of pregnancy              O26.90   (abnormal GTT)   Encounter for other antenatal screening        Z36.2   follow-up  ---------------------------------------------------------------------- Fetal Evaluation  Num Of Fetuses:         1  Fetal Heart Rate(bpm):  144  Cardiac Activity:       Observed  Presentation:           Cephalic  Placenta:               Anterior  P. Cord Insertion:       Previously Visualized  Amniotic Fluid  AFI FV:      Within normal limits  AFI Sum(cm)     %Tile       Largest Pocket(cm)  13.73           47          4.94  RUQ(cm)       RLQ(cm)       LUQ(cm)        LLQ(cm)  4.94          4.2           0              4.59 ---------------------------------------------------------------------- Biophysical Evaluation  Amniotic F.V:   Within normal limits       F. Tone:        Observed  F. Movement:    Observed                   Score:          8/8  F. Breathing:   Observed ---------------------------------------------------------------------- Biometry  BPD:      86.8  mm     G. Age:  35w 51d  70  %    CI:        78.63   %    70 - 86                                                          FL/HC:      21.6   %    19.4 - 21.8  HC:      309.6  mm     G. Age:  34w 4d         23  %    HC/AC:      0.98        0.96 - 1.11  AC:      315.5  mm     G. Age:  35w 3d         84  %    FL/BPD:     77.0   %    71 - 87  FL:       66.8  mm     G. Age:  34w 3d         43  %    FL/AC:      21.2   %    20 - 24  Est. FW:    2578  gm    5 lb 11 oz      75  % ---------------------------------------------------------------------- OB History  Gravidity:    4         Term:   3        Prem:   0        SAB:   0  TOP:          0       Ectopic:  0        Living: 3 ---------------------------------------------------------------------- Gestational Age  LMP:           34w 2d        Date:  02/04/18                 EDD:   11/11/18  U/S Today:     34w 6d                                        EDD:   11/07/18  Best:          34w 2d     Det. By:  LMP  (02/04/18)          EDD:   11/11/18 ---------------------------------------------------------------------- Anatomy  Cranium:               Appears normal         LVOT:                   Previously seen  Cavum:                 Previously seen        Aortic Arch:            Previously seen  Ventricles:            Previously seen        Ductal Arch:  Previously  seen  Choroid Plexus:        Previously seen        Diaphragm:              Previously seen  Cerebellum:            Previously seen        Stomach:                Appears normal, left                                                                        sided  Posterior Fossa:       Previously seen        Abdomen:                Previously seen  Nuchal Fold:           Not applicable (>20    Abdominal Wall:         Previously seen                         wks GA)  Face:                  Orbits and profile     Cord Vessels:           Previously seen                         previously seen  Lips:                  Previously seen        Kidneys:                Appear normal  Palate:                Not well visualized    Bladder:                Appears normal  Thoracic:              Appears normal         Spine:                  Previously seen  Heart:                 Previously seen        Upper Extremities:      Previously seen  RVOT:                  Previously seen        Lower Extremities:      Previously seen ---------------------------------------------------------------------- Cervix Uterus Adnexa  Cervix  Not visualized (advanced GA >24wks) ---------------------------------------------------------------------- Impression  Normal interval growth.  Biophysical profile 8/8 ---------------------------------------------------------------------- Recommendations  Scheduled for weekly BPP and NST. ----------------------------------------------------------------------               Lin Landsman, MD Electronically Signed Final Report   10/03/2018 10:08 am ----------------------------------------------------------------------  US Fetal Bpp W/nonstress  Result Date: 10/03/2018 ----------------------------------------------------------------------  OBSTETRICS REPORT                       (  Signed Final 10/03/2018 10:08 am) ---------------------------------------------------------------------- Patient Info  ID #:        [id number]                          D.O.B.:  1986-04-24 (32 yrs)  Name:       Judy Alvarez                 Visit Date: 10/02/2018 04:12 pm ---------------------------------------------------------------------- Performed By  Performed By:     Percell Boston          Ref. Address:     [address]                                                             [place], [place]  Attending:        Lin Landsman      [facility]                    MD                                       Fetal Care  Referred By:      Everardo All ---------------------------------------------------------------------- Orders   #  Description                          Code         Ordered By   1  [place] MFM OB FOLLOW UP                  [id number]     JENNIFER RASCH   2  US FETAL BPP W/NONSTRESS             40981.1      JENNIFER [facility]  ----------------------------------------------------------------------   #  Order #                    Accession #                 Episode #   1  [id number]                  [id number]                  [id number]   2  [id number]                  [id number]                  [id number]  ---------------------------------------------------------------------- Indications   [redacted] weeks gestation of pregnancy                Z3A.34   Medical complication of pregnancy              O26.90   (abnormal GTT)   Encounter for other antenatal screening        Z36.2   follow-up  ---------------------------------------------------------------------- Fetal Evaluation  Num Of Fetuses:  1  Fetal Heart Rate(bpm):  144  Cardiac Activity:       Observed  Presentation:           Cephalic  Placenta:               Anterior  P. Cord Insertion:      Previously Visualized  Amniotic Fluid  AFI FV:      Within normal limits  AFI Sum(cm)     %Tile       Largest Pocket(cm)  13.73           47          4.94  RUQ(cm)       RLQ(cm)       LUQ(cm)        LLQ(cm)  4.94          4.2           0               4.59 ---------------------------------------------------------------------- Biophysical Evaluation  Amniotic F.V:   Within normal limits       F. Tone:        Observed  F. Movement:    Observed                   Score:          8/8  F. Breathing:   Observed ---------------------------------------------------------------------- Biometry  BPD:      86.8  mm     G. Age:  35w 0d         70  %    CI:        78.63   %    70 - 86                                                          FL/HC:      21.6   %    19.4 - 21.8  HC:      309.6  mm     G. Age:  34w 4d         23  %    HC/AC:      0.98        0.96 - 1.11  AC:      315.5  mm     G. Age:  35w 3d         84  %    FL/BPD:     77.0   %    71 - 87  FL:       66.8  mm     G. Age:  34w 3d         43  %    FL/AC:      21.2   %    20 - 24  Est. FW:    2578  gm    5 lb 11 oz      75  % ---------------------------------------------------------------------- OB History  Gravidity:    4         Term:   3        Prem:   0        SAB:   0  TOP:          0  Ectopic:  0        Living: 3 ---------------------------------------------------------------------- Gestational Age  LMP:           34w 2d        Date:  02/04/18                 EDD:   11/11/18  U/S Today:     34w 6d                                        EDD:   11/07/18  Best:          34w 2d     Det. By:  LMP  (02/04/18)          EDD:   11/11/18 ---------------------------------------------------------------------- Anatomy  Cranium:               Appears normal         LVOT:                   Previously seen  Cavum:                 Previously seen        Aortic Arch:            Previously seen  Ventricles:            Previously seen        Ductal Arch:            Previously seen  Choroid Plexus:        Previously seen        Diaphragm:              Previously seen  Cerebellum:            Previously seen        Stomach:                Appears normal, left                                                                         sided  Posterior Fossa:       Previously seen        Abdomen:                Previously seen  Nuchal Fold:           Not applicable (>20    Abdominal Wall:         Previously seen                         wks GA)  Face:                  Orbits and profile     Cord Vessels:           Previously seen                         previously seen  Lips:  Previously seen        Kidneys:                Appear normal  Palate:                Not well visualized    Bladder:                Appears normal  Thoracic:              Appears normal         Spine:                  Previously seen  Heart:                 Previously seen        Upper Extremities:      Previously seen  RVOT:                  Previously seen        Lower Extremities:      Previously seen ---------------------------------------------------------------------- Cervix Uterus Adnexa  Cervix  Not visualized (advanced GA >24wks) ---------------------------------------------------------------------- Impression  Normal interval growth.  Biophysical profile 8/8 ---------------------------------------------------------------------- Recommendations  Scheduled for weekly BPP and NST. ----------------------------------------------------------------------               Lin Landsman, MD Electronically Signed Final Report   10/03/2018 10:08 am ----------------------------------------------------------------------   Assessment and Plan:  Pregnancy: [id number] at [redacted]w[redacted]d  1. Gestational diabetes mellitus (GDM) in third trimester, gestational diabetes method of control unspecified - she checked it twice yesterday, 101 2 hours after dinner and 96 fasting - she plans to get more strips since she has been running low. Her fasting yesterday was 90.  - Due to insurance concerns, she plans to deliver at [facility]. She cannot seem to get an OB appt there so she just plans to go there in labor.   Term labor symptoms and general obstetric precautions  including but not limited to vaginal bleeding, contractions, leaking of fluid and fetal movement were reviewed in detail with the patient. I discussed the assessment and treatment plan with the patient. The patient was provided an opportunity to ask questions and all were answered. The patient agreed with the plan and demonstrated an understanding of the instructions. The patient was advised to call back or seek an in-person office evaluation/go to [facility] at [facility] for any urgent or concerning symptoms. Please refer to After Visit Summary for other counseling recommendations.   I provided 10 minutes of face-to-face time during this encounter.  No follow-ups on file.  Future Appointments  Date Time Provider Department Center  11/02/2018  1:45 PM Allie Bossier, MD [facility]    Allie Bossier, MD [facility], [facility]

## 2018-10-26 NOTE — Progress Notes (Signed)
Pt has not taken BP or weight today

## 2018-11-02 ENCOUNTER — Encounter: Payer: Self-pay | Admitting: *Deleted

## 2018-11-02 ENCOUNTER — Other Ambulatory Visit: Payer: Self-pay

## 2018-11-02 ENCOUNTER — Ambulatory Visit (INDEPENDENT_AMBULATORY_CARE_PROVIDER_SITE_OTHER): Payer: BLUE CROSS/BLUE SHIELD | Admitting: Obstetrics & Gynecology

## 2018-11-02 VITALS — BP 136/89 | HR 83 | Wt 189.0 lb

## 2018-11-02 DIAGNOSIS — O24419 Gestational diabetes mellitus in pregnancy, unspecified control: Secondary | ICD-10-CM

## 2018-11-02 DIAGNOSIS — Z3A38 38 weeks gestation of pregnancy: Secondary | ICD-10-CM

## 2018-11-02 DIAGNOSIS — Z3480 Encounter for supervision of other normal pregnancy, unspecified trimester: Secondary | ICD-10-CM

## 2018-11-02 NOTE — Progress Notes (Signed)
   PRENATAL VISIT NOTE  Subjective:  Judy Alvarez is a 33 y.o. G4P3003 at [redacted]w[redacted]d being seen today for ongoing prenatal care.  She is currently monitored for the following issues for this high-risk pregnancy and has H/O oophorectomy; Supervision of other normal pregnancy, antepartum; Abnormal glucose tolerance test (GTT) during pregnancy, antepartum; and GDM (gestational diabetes mellitus) on their problem list.  Patient reports no complaints.  Contractions: Irritability. Vag. Bleeding: None.  Movement: Present. Denies leaking of fluid.   The following portions of the patient's history were reviewed and updated as appropriate: allergies, current medications, past family history, past medical history, past social history, past surgical history and problem list.   Objective:   Vitals:   11/02/18 1323  BP: 136/89  Pulse: 83  Weight: 189 lb (85.7 kg)    Fetal Status: Fetal Heart Rate (bpm): 138   Movement: Present     General:  Alert, oriented and cooperative. Patient is in no acute distress.  Skin: Skin is warm and dry. No rash noted.   Cardiovascular: Normal heart rate noted  Respiratory: Normal respiratory effort, no problems with respiration noted  Abdomen: Soft, gravid, appropriate for gestational age.  Pain/Pressure: Present     Pelvic: Cervical exam performed        Extremities: Normal range of motion.  Edema: Trace  Mental Status: Normal mood and affect. Normal behavior. Normal judgment and thought content.   Assessment and Plan:  Pregnancy: G4P3003 at [redacted]w[redacted]d 1. Gestational diabetes mellitus (GDM) in third trimester, gestational diabetes method of control unspecified -- good sugars, no meds  2. Supervision of other normal pregnancy, antepartum IOL at 39 weeks with GDM  Term labor symptoms and general obstetric precautions including but not limited to vaginal bleeding, contractions, leaking of fluid and fetal movement were reviewed in detail with the patient. Please refer to  After Visit Summary for other counseling recommendations.   No follow-ups on file.  Future Appointments  Date Time Provider Department Center  11/02/2018  1:45 PM Allie Bossier, MD [facility]    Allie Bossier, MD

## 2018-11-03 ENCOUNTER — Telehealth (HOSPITAL_COMMUNITY): Payer: Self-pay | Admitting: *Deleted

## 2018-11-03 ENCOUNTER — Encounter (HOSPITAL_COMMUNITY): Payer: Self-pay | Admitting: *Deleted

## 2018-11-03 ENCOUNTER — Other Ambulatory Visit (HOSPITAL_COMMUNITY): Payer: Self-pay | Admitting: *Deleted

## 2018-11-03 NOTE — Telephone Encounter (Signed)
Preadmission screen  

## 2018-11-04 ENCOUNTER — Other Ambulatory Visit: Payer: Self-pay

## 2018-11-04 ENCOUNTER — Inpatient Hospital Stay (HOSPITAL_COMMUNITY): Payer: BLUE CROSS/BLUE SHIELD | Admitting: Anesthesiology

## 2018-11-04 ENCOUNTER — Encounter (HOSPITAL_COMMUNITY): Payer: Self-pay | Admitting: *Deleted

## 2018-11-04 ENCOUNTER — Inpatient Hospital Stay (HOSPITAL_COMMUNITY): Payer: BLUE CROSS/BLUE SHIELD

## 2018-11-04 ENCOUNTER — Inpatient Hospital Stay (HOSPITAL_COMMUNITY)
Admission: AD | Admit: 2018-11-04 | Discharge: 2018-11-05 | DRG: 807 | Disposition: A | Discharge status: Home / Self Care | Payer: BLUE CROSS/BLUE SHIELD | Attending: Obstetrics and Gynecology | Admitting: Obstetrics and Gynecology

## 2018-11-04 DIAGNOSIS — Z3A39 39 weeks gestation of pregnancy: Secondary | ICD-10-CM | POA: Diagnosis not present

## 2018-11-04 DIAGNOSIS — O24420 Gestational diabetes mellitus in childbirth, diet controlled: Secondary | ICD-10-CM | POA: Diagnosis not present

## 2018-11-04 DIAGNOSIS — O2492 Unspecified diabetes mellitus in childbirth: Secondary | ICD-10-CM | POA: Diagnosis not present

## 2018-11-04 DIAGNOSIS — Z3480 Encounter for supervision of other normal pregnancy, unspecified trimester: Secondary | ICD-10-CM

## 2018-11-04 DIAGNOSIS — Z1159 Encounter for screening for other viral diseases: Secondary | ICD-10-CM | POA: Diagnosis not present

## 2018-11-04 DIAGNOSIS — O24419 Gestational diabetes mellitus in pregnancy, unspecified control: Secondary | ICD-10-CM

## 2018-11-04 HISTORY — DX: Gestational diabetes mellitus in pregnancy, diet controlled: O24.410

## 2018-11-04 LAB — TYPE AND SCREEN
ABO/RH(D): A POS
Antibody Screen: NEGATIVE

## 2018-11-04 LAB — GLUCOSE, CAPILLARY
Glucose-Capillary: 106 mg/dL — ABNORMAL HIGH (ref 70–99)
Glucose-Capillary: 60 mg/dL — ABNORMAL LOW (ref 70–99)
Glucose-Capillary: 81 mg/dL (ref 70–99)

## 2018-11-04 LAB — CBC
HCT: 32.1 % — ABNORMAL LOW (ref 36.0–46.0)
Hemoglobin: 10.3 g/dL — ABNORMAL LOW (ref 12.0–15.0)
MCH: 27 pg (ref 26.0–34.0)
MCHC: 32.1 g/dL (ref 30.0–36.0)
MCV: 84.3 fL (ref 80.0–100.0)
Platelets: 192 10*3/uL (ref 150–400)
RBC: 3.81 MIL/uL — ABNORMAL LOW (ref 3.87–5.11)
RDW: 14 % (ref 11.5–15.5)
WBC: 9.2 10*3/uL (ref 4.0–10.5)
nRBC: 0 % (ref 0.0–0.2)

## 2018-11-04 LAB — SARS CORONAVIRUS 2 BY RT PCR (HOSPITAL ORDER, PERFORMED IN ~~LOC~~ HOSPITAL LAB): SARS Coronavirus 2: NEGATIVE

## 2018-11-04 LAB — ABO/RH: ABO/RH(D): A POS

## 2018-11-04 MED ORDER — PRENATAL MULTIVITAMIN CH
1.0000 | ORAL_TABLET | Freq: Every day | ORAL | Status: DC
  Administered 2018-11-05: 12:00:00 1 via ORAL
  Filled 2018-11-04: qty 1

## 2018-11-04 MED ORDER — LIDOCAINE HCL (PF) 1 % IJ SOLN: 30.0000 mL | INTRAMUSCULAR | Status: DC | PRN

## 2018-11-04 MED ORDER — TERBUTALINE SULFATE 1 MG/ML IJ SOLN: 0.2500 mg | Freq: Once | INTRAMUSCULAR | Status: DC | PRN

## 2018-11-04 MED ORDER — LACTATED RINGERS IV SOLN
500.0000 mL | Freq: Once | INTRAVENOUS | Status: AC
  Administered 2018-11-04: 11:00:00 via INTRAVENOUS

## 2018-11-04 MED ORDER — DIPHENHYDRAMINE HCL 25 MG PO CAPS: 25.0000 mg | ORAL_CAPSULE | Freq: Four times a day (QID) | ORAL | Status: DC | PRN

## 2018-11-04 MED ORDER — PHENYLEPHRINE 40 MCG/ML (10ML) SYRINGE FOR IV PUSH (FOR BLOOD PRESSURE SUPPORT): 80.0000 ug | PREFILLED_SYRINGE | INTRAVENOUS | Status: DC | PRN

## 2018-11-04 MED ORDER — ONDANSETRON HCL 4 MG PO TABS: 4.0000 mg | ORAL_TABLET | ORAL | Status: DC | PRN

## 2018-11-04 MED ORDER — LACTATED RINGERS IV SOLN: 500.0000 mL | Freq: Once | INTRAVENOUS | Status: DC

## 2018-11-04 MED ORDER — EPHEDRINE 5 MG/ML INJ: 10.0000 mg | INTRAVENOUS | Status: DC | PRN

## 2018-11-04 MED ORDER — FENTANYL-BUPIVACAINE-NACL 0.5-0.125-0.9 MG/250ML-% EP SOLN: 12.0000 mL/h | EPIDURAL | Status: DC | PRN

## 2018-11-04 MED ORDER — SIMETHICONE 80 MG PO CHEW: 80.0000 mg | CHEWABLE_TABLET | ORAL | Status: DC | PRN

## 2018-11-04 MED ORDER — LACTATED RINGERS IV SOLN
INTRAVENOUS | Status: DC
  Administered 2018-11-04 (×2): via INTRAVENOUS

## 2018-11-04 MED ORDER — COCONUT OIL OIL
1.0000 "application " | TOPICAL_OIL | Status: DC | PRN
  Administered 2018-11-05: 1 via TOPICAL

## 2018-11-04 MED ORDER — FENTANYL-BUPIVACAINE-NACL 0.5-0.125-0.9 MG/250ML-% EP SOLN
12.0000 mL/h | EPIDURAL | Status: DC | PRN
  Filled 2018-11-04: qty 250

## 2018-11-04 MED ORDER — WITCH HAZEL-GLYCERIN EX PADS: 1.0000 "application " | MEDICATED_PAD | CUTANEOUS | Status: DC | PRN

## 2018-11-04 MED ORDER — LIDOCAINE HCL (PF) 1 % IJ SOLN
INTRAMUSCULAR | Status: DC | PRN
  Administered 2018-11-04: 5 mL via EPIDURAL

## 2018-11-04 MED ORDER — OXYTOCIN 40 UNITS IN NORMAL SALINE INFUSION - SIMPLE MED
2.5000 [IU]/h | INTRAVENOUS | Status: DC
  Filled 2018-11-04: qty 1000

## 2018-11-04 MED ORDER — LACTATED RINGERS IV SOLN
500.0000 mL | INTRAVENOUS | Status: DC | PRN
  Administered 2018-11-04: 500 mL via INTRAVENOUS

## 2018-11-04 MED ORDER — ACETAMINOPHEN 325 MG PO TABS: 650.0000 mg | ORAL_TABLET | ORAL | Status: DC | PRN

## 2018-11-04 MED ORDER — MAGNESIUM HYDROXIDE 400 MG/5ML PO SUSP: 30.0000 mL | ORAL | Status: DC | PRN

## 2018-11-04 MED ORDER — IBUPROFEN 600 MG PO TABS
600.0000 mg | ORAL_TABLET | Freq: Four times a day (QID) | ORAL | Status: DC
  Administered 2018-11-04 – 2018-11-05 (×4): 600 mg via ORAL
  Filled 2018-11-04 (×4): qty 1

## 2018-11-04 MED ORDER — SOD CITRATE-CITRIC ACID 500-334 MG/5ML PO SOLN: 30.0000 mL | ORAL | Status: DC | PRN

## 2018-11-04 MED ORDER — BENZOCAINE-MENTHOL 20-0.5 % EX AERO
1.0000 "application " | INHALATION_SPRAY | CUTANEOUS | Status: DC | PRN
  Administered 2018-11-04: 1 via TOPICAL
  Filled 2018-11-04: qty 56

## 2018-11-04 MED ORDER — ONDANSETRON HCL 4 MG/2ML IJ SOLN: 4.0000 mg | Freq: Four times a day (QID) | INTRAMUSCULAR | Status: DC | PRN

## 2018-11-04 MED ORDER — OXYCODONE-ACETAMINOPHEN 5-325 MG PO TABS: 1.0000 | ORAL_TABLET | ORAL | Status: DC | PRN

## 2018-11-04 MED ORDER — DIPHENHYDRAMINE HCL 50 MG/ML IJ SOLN: 12.5000 mg | INTRAMUSCULAR | Status: DC | PRN

## 2018-11-04 MED ORDER — OXYTOCIN BOLUS FROM INFUSION: 500.0000 mL | Freq: Once | INTRAVENOUS | Status: DC

## 2018-11-04 MED ORDER — ONDANSETRON HCL 4 MG/2ML IJ SOLN: 4.0000 mg | INTRAMUSCULAR | Status: DC | PRN

## 2018-11-04 MED ORDER — FERROUS SULFATE 325 (65 FE) MG PO TABS
325.0000 mg | ORAL_TABLET | Freq: Two times a day (BID) | ORAL | Status: DC
  Administered 2018-11-05: 325 mg via ORAL
  Filled 2018-11-04: qty 1

## 2018-11-04 MED ORDER — OXYCODONE-ACETAMINOPHEN 5-325 MG PO TABS: 2.0000 | ORAL_TABLET | ORAL | Status: DC | PRN

## 2018-11-04 MED ORDER — OXYTOCIN 40 UNITS IN NORMAL SALINE INFUSION - SIMPLE MED
1.0000 m[IU]/min | INTRAVENOUS | Status: DC
  Administered 2018-11-04: 09:00:00 2 m[IU]/min via INTRAVENOUS

## 2018-11-04 MED ORDER — SODIUM CHLORIDE (PF) 0.9 % IJ SOLN
INTRAMUSCULAR | Status: DC | PRN
  Administered 2018-11-04: 14 mL/h via EPIDURAL

## 2018-11-04 MED ORDER — PHENYLEPHRINE 40 MCG/ML (10ML) SYRINGE FOR IV PUSH (FOR BLOOD PRESSURE SUPPORT)
80.0000 ug | PREFILLED_SYRINGE | INTRAVENOUS | Status: DC | PRN
  Filled 2018-11-04: qty 10

## 2018-11-04 MED ORDER — DIBUCAINE (PERIANAL) 1 % EX OINT: 1.0000 "application " | TOPICAL_OINTMENT | CUTANEOUS | Status: DC | PRN

## 2018-11-04 NOTE — Progress Notes (Signed)
Hypoglycemic Event  CBG: 60  Treatment: 4 oz juice/soda  Symptoms: None  Follow-up CBG: Time:1235 CBG Result:81  Possible Reasons for Event: GDM  Comments/MD notified:S [facility] @ [address], [place]

## 2018-11-04 NOTE — Discharge Summary (Signed)
Obstetrics Discharge Summary OB/GYN Faculty Practice   Patient Name: Judy Alvarez DOB: 11/12/1985 MRN: [id number]  Date of admission: 11/04/2018 Delivering MD: WEINHOLD, SAMANTHA C   Date of discharge: 11/05/2018  Admitting diagnosis: pregnancy Intrauterine pregnancy: [redacted]w[redacted]d     Secondary diagnosis:   Active Problems:   GDM (gestational diabetes mellitus)   Additional problems:  . none     Discharge diagnosis: Term Pregnancy Delivered and GDM A1                                            Postpartum procedures: None  Complications: none  Outpatient Follow-Up: --6 weeks with GTT --Patient plans to use NFP for contraception --Clinic messaged on 06/06 to coordinate followup  Hospital course: Judy Alvarez is a 32 y.o. [redacted]w[redacted]d who was admitted for IOL for A1GDM. Her pregnancy was complicated by the following: H/O oophorectomy; Supervision of other normal pregnancy, antepartum; Abnormal glucose tolerance test (GTT) during pregnancy, antepartum; and GDM (gestational diabetes mellitus). Her labor course was unremarkable. Delivery was uncomplicated. Please see delivery note for additional details. Her postpartum course was uncomplicated. She was breastfeeding without difficulty. By day of discharge, she was passing flatus, urinating, eating and drinking without difficulty. Her pain was well-controlled, and she was discharged home with Motrin. She was requesting early d/c on PPD#1 if baby could also be discharged. Her fasting CBG was 84. She will follow-up in clinic in 6 weeks.   Physical exam  Vitals:   11/04/18 1836 11/04/18 2316 11/05/18 0234 11/05/18 0511  BP: 122/81 113/72 117/63 120/73  Pulse: 76 78 65 67  Resp: 18 18 18 18  Temp: 98.5 F (36.9 C) 98.2 F (36.8 C) 97.9 F (36.6 C) 97.7 F (36.5 C)  TempSrc: Oral Oral Oral Oral  SpO2:  98% 98% 98%  Weight:      Height:       General: alert, no distress Lochia: appropriate Uterine Fundus: firm Incision: N/A DVT Evaluation:  No evidence of DVT seen on physical exam. Labs: Lab Results  Component Value Date   WBC 9.2 11/04/2018   HGB 10.3 (L) 11/04/2018   HCT 32.1 (L) 11/04/2018   MCV 84.3 11/04/2018   PLT 192 11/04/2018   No flowsheet data found.  Discharge instructions: Per After Visit Summary and "Baby and Me Booklet"  After visit meds:  Allergies as of 11/05/2018      Reactions   Penicillins Anaphylaxis   Infant      Medication List    STOP taking these medications   Accu-Chek FastClix Lancets Misc   blood glucose meter kit and supplies Kit   glucose blood test strip   metFORMIN 500 MG tablet Commonly known as:  GLUCOPHAGE     TAKE these medications   ibuprofen 600 MG tablet Commonly known as:  ADVIL Take 1 tablet (600 mg total) by mouth every 6 (six) hours as needed.   PRENATAL VITAMINS PO Take 1 tablet by mouth daily. Reported on 10/10/2015       Postpartum contraception: Natural Family Planning Diet: Routine Diet Activity: Advance as tolerated. Pelvic rest for 6 weeks.   Follow-up Appt:No future appointments. Follow-up Visit:No follow-ups on file.  Newborn Data: Live born female  Birth Weight: 7 lb 15.2 oz (3605 g) APGAR: 9, 9  Newborn Delivery   Birth date/time:  11/04/2018 15:10:00 Delivery type:  Vaginal, Spontaneous       Baby Feeding: Breast Disposition:home with mother   Myrtis Ser [facility] 11/05/2018 10:23 AM

## 2018-11-04 NOTE — H&P (Signed)
Judy Alvarez is a 33 y.o. female presenting for IOL for A1GDM. At time of admission patient denies vaginal bleeding, leaking of fluid, decreased fetal movement, fever, falls, or recent illness.    Prenatal History --Prenatal care at [facility] --Dating based on LMP c/w [place] --EFW 2578g (75%) at 8 w  OB History    Gravida  4   Para  3   Term  3   Preterm      AB      Living  3     SAB      TAB      Ectopic      Multiple  0   Live Births  3          Patient Active Problem List   Diagnosis Date Noted  . GDM (gestational diabetes mellitus) 10/24/2018  . Abnormal glucose tolerance test (GTT) during pregnancy, antepartum 09/13/2018  . Supervision of other normal pregnancy, antepartum 04/19/2018  . H/O oophorectomy 06/20/2015    Past Medical History:  Diagnosis Date  . Anemia    Past Surgical History:  Procedure Laterality Date  . APPENDECTOMY    . 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 Social History:  reports that she has never smoked. She has never used smokeless tobacco. She reports that she does not drink alcohol or use drugs.     Maternal Diabetes: Yes:  Diabetes Type:  Diet controlled Genetic Screening: Normal Maternal Ultrasounds/Referrals: Normal Fetal Ultrasounds or other Referrals:  None Maternal Substance Abuse:  No Significant Maternal Medications:  None Significant Maternal Lab Results:  None Other Comments:  GBS NEGATIVE  Review of Systems  Constitutional: Negative for chills and fever.  Respiratory: Negative for shortness of breath.   Gastrointestinal: Negative for abdominal pain.  Musculoskeletal: Negative for back pain.  Neurological: Negative for headaches.  All other systems reviewed and are negative.  Dilation: 5 Effacement (%): 80 Station: 0, -1 Exam by:: RZhang,rnc-ob Blood pressure 125/75, pulse 82, temperature 98.2 F  (36.8 C), temperature source Oral, resp. rate 20, height 5\' 2"  (1.575 m), weight 85.7 kg, last menstrual period 02/04/2018, SpO2 99 %, currently breastfeeding.   Physical Exam  Nursing note and vitals reviewed. Constitutional: She is oriented to person, place, and time. She appears well-developed and well-nourished.  Cardiovascular: Normal rate.  Respiratory: Effort normal. No respiratory distress.  GI: She exhibits no distension. There is no abdominal tenderness. There is no rebound and no guarding.  Neurological: She is alert and oriented to person, place, and time.  Skin: Skin is warm and dry.  Psychiatric: She has a normal mood and affect. Her behavior is normal. Judgment and thought content normal.    Prenatal labs: ABO, Rh: --/--/A POS, A POS Performed at [facility], [address], [place], [place] [address]  [phone]) Antibody: NEG (06/06 0744) Rubella: 1.46 (11/22 1015) RPR: NON-REACTIVE (03/27 0824)  HBsAg: NON-REACTIVE (11/22 1015)  HIV: NON-REACTIVE (03/27 0824)  GBS: Negative (05/21 0000)   Fetal Well-Being --Reactive tracing: baseline 130, mod variability, positive accels, no decels --7-8lb by Curlene Labrum  Assessment/Plan: --33 y.o. G4P3003 at [redacted]w[redacted]d  --A1GDM, q 4 hour CBGs, q 1-2 hours in active labor --Reactive Tracing --GBS NEGATIVE --Cervix 5cm and soft on admission. Vertex by suture per RN. Start Pitocin titration --Pt desires epidural, may have upon request --Girl/breast/NFP --Anticipate NSVD  Calvert CantorSamantha C Tyyne Cliett, CNM 11/04/2018, 10:18 AM

## 2018-11-04 NOTE — Anesthesia Preprocedure Evaluation (Addendum)
Anesthesia Evaluation  Patient identified by MRN, date of birth, ID band Patient awake    Reviewed: Allergy & Precautions, Patient's Chart, lab work & pertinent test results  Airway Mallampati: I       Dental no notable dental hx.    Pulmonary neg pulmonary ROS,    Pulmonary exam normal        Cardiovascular negative cardio ROS Normal cardiovascular exam     Neuro/Psych    GI/Hepatic   Endo/Other  diabetes, Type 2, Oral Hypoglycemic Agents  Renal/GU      Musculoskeletal   Abdominal Normal abdominal exam  (+)   Peds  Hematology   Anesthesia Other Findings   Reproductive/Obstetrics (+) Pregnancy                            Anesthesia Physical Anesthesia Plan  ASA: II  Anesthesia Plan: Epidural   Post-op Pain Management:    Induction:   PONV Risk Score and Plan:   Airway Management Planned:   Additional Equipment: None  Intra-op Plan:   Post-operative Plan:   Informed Consent: I have reviewed the patients History and Physical, chart, labs and discussed the procedure including the risks, benefits and alternatives for the proposed anesthesia with the patient or authorized representative who has indicated his/her understanding and acceptance.       Plan Discussed with:   Anesthesia Plan Comments: (Lab Results      Component                Value               Date                      WBC                      9.2                 11/04/2018                HGB                      10.3 (L)            11/04/2018                HCT                      32.1 (L)            11/04/2018                MCV                      84.3                11/04/2018                PLT                      192                 11/04/2018            COVID-19 Labs  No results for input(s): DDIMER, FERRITIN, LDH, CRP in the last 72 hours.  Lab Results      Component  Value                Date                      [id number]              NEGATIVE            11/04/2018            )        Anesthesia Quick Evaluation

## 2018-11-04 NOTE — Progress Notes (Signed)
At 0748, pt c/o feeling faint, lightheaded and weak after IV started. Turned to left side, IV bolus started. BP evaluated.

## 2018-11-04 NOTE — Anesthesia Procedure Notes (Signed)
Epidural Patient location during procedure: OB Start time: 11/04/2018 10:39 AM End time: 11/04/2018 10:45 AM  Staffing Anesthesiologist: Effie Berkshire, MD Performed: anesthesiologist   Preanesthetic Checklist Completed: patient identified, site marked, surgical consent, pre-op evaluation, timeout performed, IV checked, risks and benefits discussed and monitors and equipment checked  Epidural Patient position: sitting Prep: ChloraPrep Patient monitoring: heart rate, continuous pulse ox and blood pressure Approach: midline Location: L3-L4 Injection technique: LOR saline  Needle:  Needle type: Tuohy  Needle gauge: 17 G Needle length: 9 cm Catheter type: closed end flexible Catheter size: 20 Guage Test dose: negative and 1.5% lidocaine  Assessment Events: blood not aspirated, injection not painful, no injection resistance and no paresthesia  Additional Notes LOR @ 4  Patient identified. Risks/Benefits/Options discussed with patient including but not limited to bleeding, infection, nerve damage, paralysis, failed block, incomplete pain control, headache, blood pressure changes, nausea, vomiting, reactions to medications, itching and postpartum back pain. Confirmed with bedside nurse the patient's most recent platelet count. Confirmed with patient that they are not currently taking any anticoagulation, have any bleeding history or any family history of bleeding disorders. Patient expressed understanding and wished to proceed. All questions were answered. Sterile technique was used throughout the entire procedure. Please see nursing notes for vital signs. Test dose was given through epidural catheter and negative prior to continuing to dose epidural or start infusion. Warning signs of high block given to the patient including shortness of breath, tingling/numbness in hands, complete motor block, or any concerning symptoms with instructions to call for help. Patient was given instructions on  fall risk and not to get out of bed. All questions and concerns addressed with instructions to call with any issues or inadequate analgesia.    Reason for block:procedure for pain

## 2018-11-04 NOTE — Discharge Instructions (Signed)

## 2018-11-05 LAB — GLUCOSE, CAPILLARY: Glucose-Capillary: 84 mg/dL (ref 70–99)

## 2018-11-05 LAB — RPR: RPR Ser Ql: NONREACTIVE

## 2018-11-05 MED ORDER — IBUPROFEN 600 MG PO TABS: 600.0000 mg | ORAL_TABLET | Freq: Four times a day (QID) | ORAL | 0 refills | Status: AC | PRN

## 2018-11-05 NOTE — Lactation Note (Signed)
This note was copied from a baby's chart. Lactation Consultation Note  Patient Name: Judy Alvarez [id number] Date: 11/05/2018 Reason for consult: Follow-up assessment;Term;Infant weight loss  Baby is 40 hours old  Mom and dad desire early D/C today at 24 hours.  During consult baby work up with a large wet diaper, mom changed it  And independently latched with the cradle position, shallow at 1st and re-latched  And eased chin, shallows noted. Baby opens wide with flanged lips.  Per mom nipples feel sensitive today, LC reminded mom prior to latching - breast  Massage , hand express, reverse pressure if needed.  LC instructed mom on the use shells between feedings except when sleeping.  Sore nipple and engorgement prevention and tx reviewed.  Per mom has 2 DEBP's at home if needed.  LC discussed importance of STS until the baby is back to birth weight, gaining steadily and  Can stay awake for majority of feeding.  Discussed nutritive vs non - nutritive feeding patterns and the importance of watching the baby for  Hanging out latched.  Mom mentioned she is having a lot of cramping - LC recommended for the next 72 hours prior  To breast feeding empty bladder and it will help the cramping decrease. ( LC mentioned cramping is  Normal, good sign for let down, and since mom is  P 4 the cramping may  be mor intense).   LC updated the doc flow sheets per mom.   Mom aware of the [place] Lactation resources if needed.     Maternal Data Has patient been taught Hand Expression?: Yes  Feeding Feeding Type: (last fed at 0900 )  LATCH Score Latch: Grasps breast easily, tongue down, lips flanged, rhythmical sucking.  Audible Swallowing: A few with stimulation  Type of Nipple: Everted at rest and after stimulation  Comfort (Breast/Nipple): Filling, red/small blisters or bruises, mild/mod discomfort  Hold (Positioning): No assistance needed to correctly position infant at  breast.  LATCH Score: 8  Interventions Interventions: Breast feeding basics reviewed  Lactation Tools Discussed/Used Tools: Shells Shell Type: Inverted Pump Review: Milk Storage   Consult Status Consult Status: Complete Date: 11/05/18    Jerlyn Ly [facility] 11/05/2018, 10:24 AM

## 2018-11-05 NOTE — Lactation Note (Addendum)
This note was copied from a baby's chart. Lactation Consultation Note  Patient Name: Judy Alvarez WCHEN'I Date: 11/05/2018 Reason for consult: Initial assessment;Term P4, 25 hour female infant. Mom is experienced at breastfeeding, she breastfeed her other 3 children for one year each. Mom has DEBP at home. Per mom, infant had 5x emesis earlier with clear mucous.  Per parents, infant had 4 stools and 3 wet diapers at 15 hours. Mom latched infant on right breast using cradle hold, swallows observed and infant was still breastfeeding when [place] left room (7 minutes).  LC did not assist with latching infant at breast. Mom knows to breastfeed infant according hunger cues, 8 to 12 times within 24 hours, breastfeed on demand. Parents will continue to do STS as much as possible.  LC discussed I & O. Reviewed Baby & Me book's Breastfeeding Basics.  Mom made aware of O/P services, breastfeeding support groups, community resources, and our phone # for post-discharge questions.  Maternal Data Formula Feeding for Exclusion: No Has patient been taught Hand Expression?: Yes Does the patient have breastfeeding experience prior to this delivery?: Yes  Feeding Feeding Type: Breast Fed  LATCH Score Latch: Grasps breast easily, tongue down, lips flanged, rhythmical sucking.  Audible Swallowing: A few with stimulation  Type of Nipple: Everted at rest and after stimulation  Comfort (Breast/Nipple): Soft / non-tender  Hold (Positioning): No assistance needed to correctly position infant at breast.  LATCH Score: 9  Interventions Interventions: Breast feeding basics reviewed;Skin to skin;Hand express;Breast compression;Support pillows;Position options  Lactation Tools Discussed/Used WIC Program: No(Per mom, she has DEBP at home.)   Consult Status Consult Status: Follow-up Date: 11/05/18 Follow-up type: In-patient    Vicente Serene 11/05/2018, 6:26 AM

## 2018-11-05 NOTE — Anesthesia Postprocedure Evaluation (Signed)
Anesthesia Post Note  Patient: Judy Alvarez  Procedure(s) Performed: AN AD [place]     Patient location during evaluation: Mother Baby Anesthesia Type: Epidural Level of consciousness: awake and alert Pain management: pain level controlled Vital Signs Assessment: post-procedure vital signs reviewed and stable Respiratory status: spontaneous breathing, nonlabored ventilation and respiratory function stable Cardiovascular status: stable Postop Assessment: no headache, no backache and epidural receding Anesthetic complications: no    Last Vitals:  Vitals:   11/05/18 0234 11/05/18 0511  BP: 117/63 120/73  Pulse: 65 67  Resp: 18 18  Temp: 36.6 C 36.5 C  SpO2: 98% 98%    Last Pain:  Vitals:   11/05/18 0720  TempSrc:   PainSc: 0-No pain   Pain Goal: Patients Stated Pain Goal: 6 (11/04/18 1031)                 Rayvon Char

## 2018-11-06 ENCOUNTER — Inpatient Hospital Stay (HOSPITAL_COMMUNITY): Payer: BLUE CROSS/BLUE SHIELD

## 2018-12-22 ENCOUNTER — Ambulatory Visit: Payer: BLUE CROSS/BLUE SHIELD | Admitting: Certified Nurse Midwife

## 2021-07-29 DIAGNOSIS — Z3481 Encounter for supervision of other normal pregnancy, first trimester: Secondary | ICD-10-CM | POA: Diagnosis not present

## 2021-12-04 DIAGNOSIS — Z3402 Encounter for supervision of normal first pregnancy, second trimester: Secondary | ICD-10-CM | POA: Diagnosis not present

## 2022-01-28 DIAGNOSIS — Z3685 Encounter for antenatal screening for Streptococcus B: Secondary | ICD-10-CM | POA: Diagnosis not present

## 2022-01-28 DIAGNOSIS — Z3483 Encounter for supervision of other normal pregnancy, third trimester: Secondary | ICD-10-CM | POA: Diagnosis not present

## 2022-02-17 DIAGNOSIS — Z3A39 39 weeks gestation of pregnancy: Secondary | ICD-10-CM | POA: Diagnosis not present

## 2022-02-17 DIAGNOSIS — O321XX0 Maternal care for breech presentation, not applicable or unspecified: Secondary | ICD-10-CM | POA: Diagnosis not present

## 2022-05-04 ENCOUNTER — Ambulatory Visit
Admission: RE | Admit: 2022-05-04 | Discharge: 2022-05-04 | Disposition: A | Discharge status: Home / Self Care | Payer: Medicaid Other | Source: Ambulatory Visit | Attending: Family Medicine | Admitting: Family Medicine

## 2022-05-04 VITALS — BP 123/87 | HR 74 | Temp 97.9°F | Resp 18 | Ht 62.0 in | Wt 171.0 lb

## 2022-05-04 DIAGNOSIS — B029 Zoster without complications: Secondary | ICD-10-CM

## 2022-05-04 MED ORDER — VALACYCLOVIR HCL 1 G PO TABS: 1000.0000 mg | ORAL_TABLET | Freq: Three times a day (TID) | ORAL | 0 refills | Status: AC

## 2022-05-04 NOTE — Discharge Instructions (Signed)
Take valacyclovir 3 times a day for 1 week Dosage of valacyclovir in milk is less than 1% of a usual infant dose and is not expected to cause any adverse effect in a breast-fed infant  Call your pediatrician for advice regarding exposure of your infant and other children to chickenpox

## 2022-05-04 NOTE — ED Provider Notes (Signed)
[facility]    CSN: [id number] Arrival date & time: 05/04/22  1023      History   Chief Complaint Chief Complaint  Patient presents with   Rash    Rash on left side. X3 days    HPI Judy Alvarez is a 36 y.o. female.   HPI  Patient has a rash on her side been there for 3 to 4 days.  It extends from the middle of her back around underneath her left breast to the midline.  It is itchy and somewhat sore.  Patient has an 24-week-old infant that she is nursing.  Has 4 other children in the home.  None are vaccinated for chickenpox. 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 Authorizing Provider  nitrofurantoin, macrocrystal-monohydrate, (MACROBID) 100 MG capsule Take 100 mg by mouth every 12 (twelve) hours. 04/28/22  Yes [provider]  valACYclovir (VALTREX) 1000 MG tablet Take 1 tablet (1,000 mg total) by mouth 3 (three) times daily. 05/04/22  Yes Eustace Moore, MD  ibuprofen (ADVIL) 600 MG tablet Take 1 tablet (600 mg total) by mouth every 6 (six) hours as needed. 11/05/18   Arabella Merles, CNM  Prenatal Multivit-Min-Fe-FA (PRENATAL VITAMINS PO) Take 1 tablet by mouth daily.  Reported on 10/10/2015    [provider]    Family History Family History  Problem Relation Age of Onset   Diabetes Other    Asthma Mother     Social History Social History   Tobacco Use   Smoking status: Never   Smokeless tobacco: Never  Vaping Use   Vaping Use: Never used  Substance Use Topics   Alcohol use: No   Drug use: No     Allergies   Penicillins   Review of Systems Review of Systems  See HPI Physical Exam Triage Vital Signs ED Triage Vitals  Enc Vitals Group     BP 05/04/22 1039 123/87     Pulse Rate 05/04/22 1039 74     Resp 05/04/22 1039 18     Temp 05/04/22 1039 97.9 F (36.6 C)     Temp Source 05/04/22 1039 Oral     SpO2 05/04/22 1039 97 %     Weight 05/04/22 1035 171 lb (77.6 kg)     Height 05/04/22 1035 5\' 2"  (1.575 m)     Head Circumference --  Peak Flow --      Pain Score 05/04/22 1035 0     Pain Loc --      Pain Edu? --      Excl. in [place]? --    No data found.  Updated Vital Signs BP 123/87 (BP Location: Right Arm)   Pulse 74   Temp 97.9 F (36.6 C) (Oral)   Resp 18   Ht 5\' 2"  (1.575 m)   Wt 77.6 kg   SpO2 97%   Breastfeeding Yes   BMI 31.28 kg/m       Physical Exam Constitutional:      General: She is not in acute distress.    Appearance: She is well-developed. She is obese.  HENT:     Head: Normocephalic and atraumatic.  Eyes:     Conjunctiva/sclera: Conjunctivae normal.     Pupils: Pupils are equal, round, and reactive to light.  Cardiovascular:     Rate and Rhythm: Normal rate.  Pulmonary:     Effort: Pulmonary effort is normal. No respiratory distress.  Abdominal:     General: There is no distension.     Palpations: Abdomen is soft.  Musculoskeletal:        General: Normal range of motion.     Cervical back: Normal range of motion.  Skin:    General: Skin is warm and dry.     Findings: Rash present.     Comments: Clusters of small vesicles from the mid back to the midline xiphoid region  underneath the left breast  Neurological:     Mental Status: She is alert.      UC Treatments / Results  Labs (all labs ordered are listed, but only abnormal results are displayed) Labs Reviewed - No data to display  EKG   Radiology No results found.  Procedures Procedures (including critical care time)  Medications Ordered in UC Medications - No data to display  Initial Impression / Assessment and Plan / UC Course  I have reviewed the triage vital signs and the nursing notes.  Pertinent labs & imaging results that were available during my care of the patient were reviewed by me and considered in my medical decision making (see chart for details).     Shingles discussed.  Concern for active infection given unvaccinated infant in children.  Advised to call pediatrician for advice.  My advice is that she pump and keep infant away from her rash until it is crusted over. I am starting her on acyclovir.  I looked it up and it is safe to take while nursing Advised them that the rash may develop in the children up to 3 weeks after her rash Availability of chickenpox vaccination is discussed with family if prevention desired Final Clinical Impressions(s) / UC Diagnoses   Final diagnoses:  Herpes zoster without complication     Discharge Instructions      Take valacyclovir 3 times a day for 1 week Dosage of valacyclovir in milk is less than 1% of a usual infant dose and is not expected to cause any adverse effect in a breast-fed infant  Call your pediatrician for advice regarding exposure of your infant and other children to chickenpox   ED Prescriptions     Medication Sig Dispense Auth. Provider   valACYclovir (VALTREX) 1000 MG tablet Take 1 tablet (1,000 mg total) by mouth 3 (three) times daily. 21 tablet Raylene Everts, MD      PDMP not reviewed this encounter.  Raylene Everts, MD 05/04/22 1106

## 2022-05-04 NOTE — ED Triage Notes (Signed)
Pt states that she has a rash on her left side. X3 days

## 2022-06-10 DIAGNOSIS — I517 Cardiomegaly: Secondary | ICD-10-CM | POA: Diagnosis not present

## 2022-06-10 DIAGNOSIS — I498 Other specified cardiac arrhythmias: Secondary | ICD-10-CM | POA: Diagnosis not present

## 2022-06-10 DIAGNOSIS — R0602 Shortness of breath: Secondary | ICD-10-CM | POA: Diagnosis not present

## 2022-06-10 DIAGNOSIS — Z3202 Encounter for pregnancy test, result negative: Secondary | ICD-10-CM | POA: Diagnosis not present

## 2022-07-20 DIAGNOSIS — Z90721 Acquired absence of ovaries, unilateral: Secondary | ICD-10-CM | POA: Diagnosis not present

## 2022-07-20 DIAGNOSIS — Z0001 Encounter for general adult medical examination with abnormal findings: Secondary | ICD-10-CM | POA: Diagnosis not present

## 2022-07-20 DIAGNOSIS — H6123 Impacted cerumen, bilateral: Secondary | ICD-10-CM | POA: Diagnosis not present

## 2022-07-20 DIAGNOSIS — Z1329 Encounter for screening for other suspected endocrine disorder: Secondary | ICD-10-CM | POA: Diagnosis not present

## 2022-07-23 DIAGNOSIS — H6123 Impacted cerumen, bilateral: Secondary | ICD-10-CM | POA: Diagnosis not present

## 2022-07-24 DIAGNOSIS — R42 Dizziness and giddiness: Secondary | ICD-10-CM | POA: Diagnosis not present

## 2022-07-25 DIAGNOSIS — R9431 Abnormal electrocardiogram [ECG] [EKG]: Secondary | ICD-10-CM | POA: Diagnosis not present

## 2022-07-28 DIAGNOSIS — R9431 Abnormal electrocardiogram [ECG] [EKG]: Secondary | ICD-10-CM | POA: Diagnosis not present

## 2022-07-28 DIAGNOSIS — Z880 Allergy status to penicillin: Secondary | ICD-10-CM | POA: Diagnosis not present

## 2022-07-28 DIAGNOSIS — N3090 Cystitis, unspecified without hematuria: Secondary | ICD-10-CM | POA: Diagnosis not present

## 2022-07-28 DIAGNOSIS — R109 Unspecified abdominal pain: Secondary | ICD-10-CM | POA: Diagnosis not present

## 2022-07-28 DIAGNOSIS — R110 Nausea: Secondary | ICD-10-CM | POA: Diagnosis not present

## 2022-07-28 DIAGNOSIS — N3000 Acute cystitis without hematuria: Secondary | ICD-10-CM | POA: Diagnosis not present

## 2022-07-28 DIAGNOSIS — R1013 Epigastric pain: Secondary | ICD-10-CM | POA: Diagnosis not present

## 2022-07-31 DIAGNOSIS — R0602 Shortness of breath: Secondary | ICD-10-CM | POA: Diagnosis not present

## 2022-07-31 DIAGNOSIS — R531 Weakness: Secondary | ICD-10-CM | POA: Diagnosis not present

## 2022-07-31 DIAGNOSIS — R5383 Other fatigue: Secondary | ICD-10-CM | POA: Diagnosis not present

## 2022-07-31 DIAGNOSIS — Z880 Allergy status to penicillin: Secondary | ICD-10-CM | POA: Diagnosis not present

## 2022-08-02 ENCOUNTER — Telehealth: Payer: Self-pay | Admitting: Licensed Clinical Social Worker

## 2022-08-02 DIAGNOSIS — R42 Dizziness and giddiness: Secondary | ICD-10-CM | POA: Diagnosis not present

## 2022-08-02 DIAGNOSIS — R110 Nausea: Secondary | ICD-10-CM | POA: Diagnosis not present

## 2022-08-02 DIAGNOSIS — R3589 Other polyuria: Secondary | ICD-10-CM | POA: Diagnosis not present

## 2022-08-02 NOTE — Transitions of Care (Post Inpatient/ED Visit) (Signed)
   08/02/2022  Name: Judy Alvarez MRN: [id number] DOB: 02-26-1986  Today's TOC FU Call Status: Today's TOC FU Call Status:: Unsuccessul Call (1st Attempt) Unsuccessful Call (1st Attempt) Date: 08/02/22  Attempted to reach the patient regarding the most recent Inpatient/ED visit.  Eula Fried, BSW, MSW, [employer] Managed Medicaid LCSW [email or web address] Phone: [phone]

## 2022-08-04 DIAGNOSIS — R634 Abnormal weight loss: Secondary | ICD-10-CM | POA: Diagnosis not present

## 2022-08-04 DIAGNOSIS — K760 Fatty (change of) liver, not elsewhere classified: Secondary | ICD-10-CM | POA: Diagnosis not present

## 2022-08-04 DIAGNOSIS — Z79899 Other long term (current) drug therapy: Secondary | ICD-10-CM | POA: Diagnosis not present

## 2022-08-04 DIAGNOSIS — R197 Diarrhea, unspecified: Secondary | ICD-10-CM | POA: Diagnosis not present

## 2022-08-04 DIAGNOSIS — R112 Nausea with vomiting, unspecified: Secondary | ICD-10-CM | POA: Diagnosis not present

## 2022-08-04 DIAGNOSIS — Z880 Allergy status to penicillin: Secondary | ICD-10-CM | POA: Diagnosis not present

## 2022-08-04 DIAGNOSIS — R1013 Epigastric pain: Secondary | ICD-10-CM | POA: Diagnosis not present

## 2022-08-05 DIAGNOSIS — R1013 Epigastric pain: Secondary | ICD-10-CM | POA: Diagnosis not present

## 2022-08-06 DIAGNOSIS — Z5321 Procedure and treatment not carried out due to patient leaving prior to being seen by health care provider: Secondary | ICD-10-CM | POA: Diagnosis not present

## 2022-08-06 DIAGNOSIS — R0602 Shortness of breath: Secondary | ICD-10-CM | POA: Diagnosis not present

## 2022-08-06 DIAGNOSIS — R079 Chest pain, unspecified: Secondary | ICD-10-CM | POA: Diagnosis not present

## 2022-08-06 DIAGNOSIS — R6883 Chills (without fever): Secondary | ICD-10-CM | POA: Diagnosis not present

## 2022-08-06 DIAGNOSIS — R0789 Other chest pain: Secondary | ICD-10-CM | POA: Diagnosis not present

## 2022-08-06 DIAGNOSIS — R1013 Epigastric pain: Secondary | ICD-10-CM | POA: Diagnosis not present

## 2022-08-10 DIAGNOSIS — R110 Nausea: Secondary | ICD-10-CM | POA: Diagnosis not present

## 2022-08-10 DIAGNOSIS — R5383 Other fatigue: Secondary | ICD-10-CM | POA: Diagnosis not present

## 2022-08-10 DIAGNOSIS — R634 Abnormal weight loss: Secondary | ICD-10-CM | POA: Diagnosis not present

## 2022-08-24 DIAGNOSIS — K219 Gastro-esophageal reflux disease without esophagitis: Secondary | ICD-10-CM | POA: Diagnosis not present

## 2022-08-24 DIAGNOSIS — R197 Diarrhea, unspecified: Secondary | ICD-10-CM | POA: Diagnosis not present

## 2022-08-24 DIAGNOSIS — R110 Nausea: Secondary | ICD-10-CM | POA: Diagnosis not present

## 2022-08-26 DIAGNOSIS — R4189 Other symptoms and signs involving cognitive functions and awareness: Secondary | ICD-10-CM | POA: Diagnosis not present

## 2022-08-26 DIAGNOSIS — R42 Dizziness and giddiness: Secondary | ICD-10-CM | POA: Diagnosis not present

## 2022-08-26 DIAGNOSIS — F419 Anxiety disorder, unspecified: Secondary | ICD-10-CM | POA: Diagnosis not present

## 2022-09-05 DIAGNOSIS — G4700 Insomnia, unspecified: Secondary | ICD-10-CM | POA: Diagnosis not present

## 2022-09-05 DIAGNOSIS — R0789 Other chest pain: Secondary | ICD-10-CM | POA: Diagnosis not present

## 2022-09-05 DIAGNOSIS — Z880 Allergy status to penicillin: Secondary | ICD-10-CM | POA: Diagnosis not present

## 2022-09-07 DIAGNOSIS — H5213 Myopia, bilateral: Secondary | ICD-10-CM | POA: Diagnosis not present

## 2022-09-22 DIAGNOSIS — R002 Palpitations: Secondary | ICD-10-CM | POA: Diagnosis not present

## 2022-09-26 DIAGNOSIS — R0602 Shortness of breath: Secondary | ICD-10-CM | POA: Diagnosis not present

## 2022-09-26 DIAGNOSIS — G479 Sleep disorder, unspecified: Secondary | ICD-10-CM | POA: Diagnosis not present

## 2022-09-26 DIAGNOSIS — K529 Noninfective gastroenteritis and colitis, unspecified: Secondary | ICD-10-CM | POA: Diagnosis not present

## 2022-09-26 DIAGNOSIS — N3289 Other specified disorders of bladder: Secondary | ICD-10-CM | POA: Diagnosis not present

## 2022-09-26 DIAGNOSIS — R109 Unspecified abdominal pain: Secondary | ICD-10-CM | POA: Diagnosis not present

## 2022-09-26 DIAGNOSIS — R079 Chest pain, unspecified: Secondary | ICD-10-CM | POA: Diagnosis not present

## 2022-09-26 DIAGNOSIS — R140 Abdominal distension (gaseous): Secondary | ICD-10-CM | POA: Diagnosis not present

## 2022-09-26 DIAGNOSIS — R002 Palpitations: Secondary | ICD-10-CM | POA: Diagnosis not present

## 2022-09-26 DIAGNOSIS — Z880 Allergy status to penicillin: Secondary | ICD-10-CM | POA: Diagnosis not present

## 2022-09-26 DIAGNOSIS — R450 Nervousness: Secondary | ICD-10-CM | POA: Diagnosis not present

## 2022-09-26 DIAGNOSIS — R112 Nausea with vomiting, unspecified: Secondary | ICD-10-CM | POA: Diagnosis not present

## 2022-09-26 DIAGNOSIS — R42 Dizziness and giddiness: Secondary | ICD-10-CM | POA: Diagnosis not present

## 2022-09-26 DIAGNOSIS — R5383 Other fatigue: Secondary | ICD-10-CM | POA: Diagnosis not present

## 2022-09-26 DIAGNOSIS — R55 Syncope and collapse: Secondary | ICD-10-CM | POA: Diagnosis not present

## 2022-09-26 DIAGNOSIS — R1013 Epigastric pain: Secondary | ICD-10-CM | POA: Diagnosis not present

## 2022-09-26 DIAGNOSIS — R000 Tachycardia, unspecified: Secondary | ICD-10-CM | POA: Diagnosis not present

## 2022-09-26 DIAGNOSIS — R0789 Other chest pain: Secondary | ICD-10-CM | POA: Diagnosis not present

## 2022-09-27 DIAGNOSIS — R002 Palpitations: Secondary | ICD-10-CM | POA: Diagnosis not present

## 2022-09-29 DIAGNOSIS — F419 Anxiety disorder, unspecified: Secondary | ICD-10-CM | POA: Diagnosis not present

## 2022-10-02 DIAGNOSIS — T43215A Adverse effect of selective serotonin and norepinephrine reuptake inhibitors, initial encounter: Secondary | ICD-10-CM | POA: Diagnosis not present

## 2022-10-02 DIAGNOSIS — T43205A Adverse effect of unspecified antidepressants, initial encounter: Secondary | ICD-10-CM | POA: Diagnosis not present

## 2022-10-02 DIAGNOSIS — F410 Panic disorder [episodic paroxysmal anxiety] without agoraphobia: Secondary | ICD-10-CM | POA: Diagnosis not present

## 2022-10-02 DIAGNOSIS — F419 Anxiety disorder, unspecified: Secondary | ICD-10-CM | POA: Diagnosis not present

## 2022-10-02 DIAGNOSIS — Z880 Allergy status to penicillin: Secondary | ICD-10-CM | POA: Diagnosis not present

## 2022-10-27 DIAGNOSIS — F418 Other specified anxiety disorders: Secondary | ICD-10-CM | POA: Diagnosis not present

## 2022-10-27 DIAGNOSIS — F410 Panic disorder [episodic paroxysmal anxiety] without agoraphobia: Secondary | ICD-10-CM | POA: Diagnosis not present

## 2022-10-27 DIAGNOSIS — O99345 Other mental disorders complicating the puerperium: Secondary | ICD-10-CM | POA: Diagnosis not present

## 2023-02-21 ENCOUNTER — Telehealth: Payer: Self-pay

## 2023-02-21 NOTE — Telephone Encounter (Signed)
Medicaid Managed Care   Unsuccessful Outreach Note  02/21/2023 Name: Tishara Lorch MRN: [id number] DOB: 1986/02/08  Referred by: System, Provider Not In Reason for referral : No chief complaint on file.   An unsuccessful telephone outreach was attempted today. The patient was referred to the case management team for assistance with care management and care coordination.   Follow Up Plan: If patient returns call to provider office, please advise to call Embedded Care Management Care Guide Nicholes Rough* at [phone]*  Nicholes Rough, CMA Care Guide [employer]

## 2023-05-26 ENCOUNTER — Ambulatory Visit: Payer: Self-pay
